# Patient Record
Sex: Male | Born: 1965
Health system: Southern US, Community
[De-identification: ages and names within clinical notes are randomized; demographics above are authoritative.]

---

## 2000-08-25 ENCOUNTER — Emergency Department (HOSPITAL_COMMUNITY): Admission: EM | Admit: 2000-08-25 | Discharge: 2000-08-25 | Payer: Self-pay | Admitting: Emergency Medicine

## 2006-10-13 ENCOUNTER — Ambulatory Visit (HOSPITAL_BASED_OUTPATIENT_CLINIC_OR_DEPARTMENT_OTHER): Admission: RE | Admit: 2006-10-13 | Discharge: 2006-10-13 | Payer: Self-pay | Admitting: General Surgery

## 2008-05-15 ENCOUNTER — Emergency Department (HOSPITAL_COMMUNITY): Admission: EM | Admit: 2008-05-15 | Discharge: 2008-05-15 | Payer: Self-pay | Admitting: Emergency Medicine

## 2009-11-04 ENCOUNTER — Emergency Department (HOSPITAL_COMMUNITY): Admission: EM | Admit: 2009-11-04 | Discharge: 2009-11-04 | Payer: Self-pay | Admitting: Emergency Medicine

## 2010-07-30 IMAGING — CT CT PELVIS W/O CM
2 of 4 series · 16 of 46 positions shown, 18 images · non-contrast
Comparison: None.

CT ABDOMEN

CLINICAL DATA: 42-year-old male with left flank pain for 2 days.

CT ABDOMEN AND PELVIS WITHOUT CONTRAST
TECHNIQUE: Multidetector CT imaging of the abdomen and pelvis was
performed following the standard protocol without intravenous
contrast.

[Series 2: stone_wo 5.0 b40f st · axial · 0.65mm/px · z∈[-510,-98]mm · 13 of 113 slices shown, 15 images]
[im 5/113  soft-tissue]
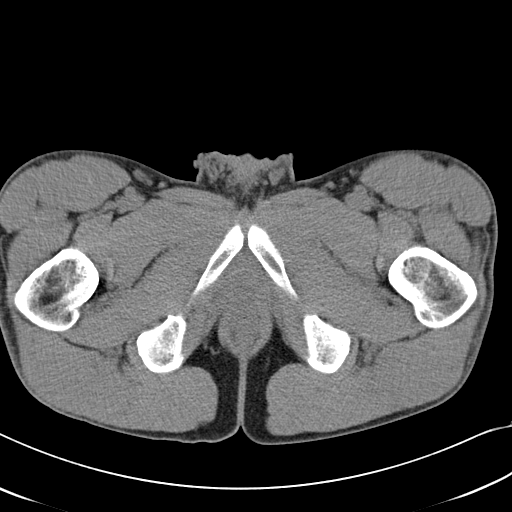
[im 5/113  bone]
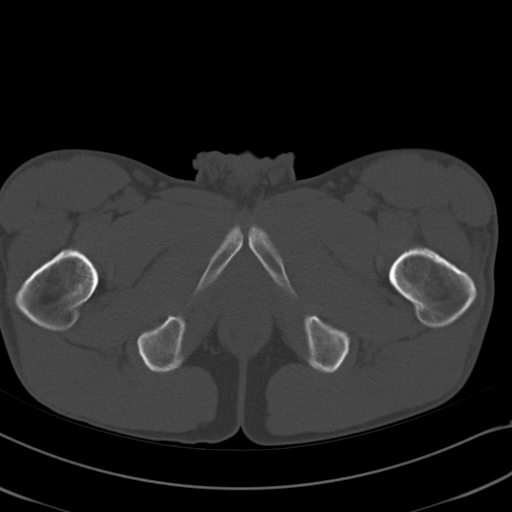
[im 14/113  soft-tissue]
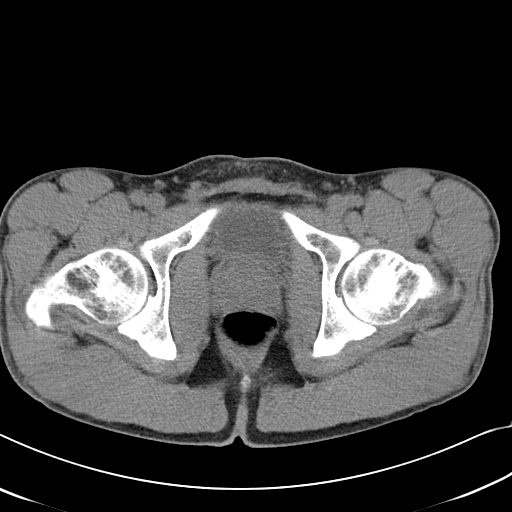
[im 23/113  soft-tissue]
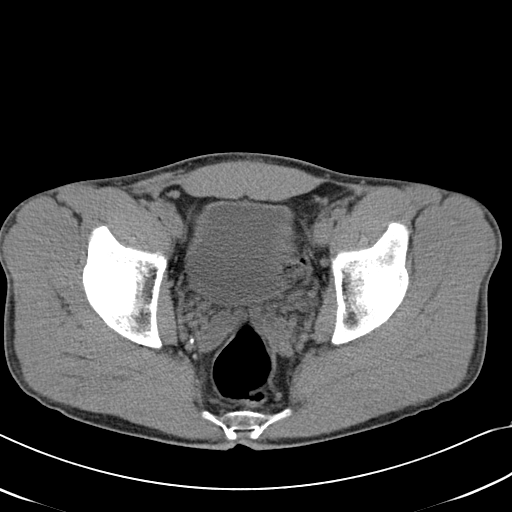
[im 32/113  soft-tissue]
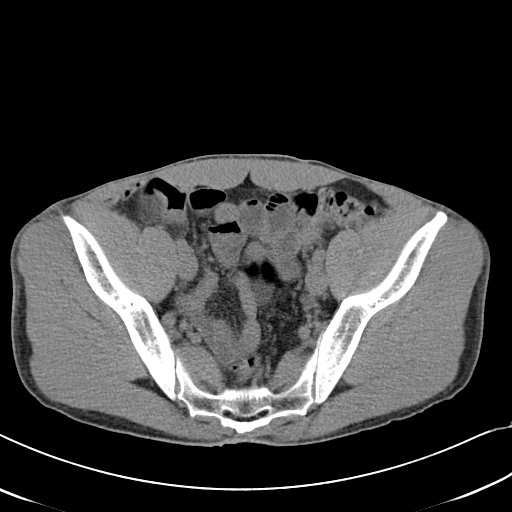
[im 41/113  soft-tissue]
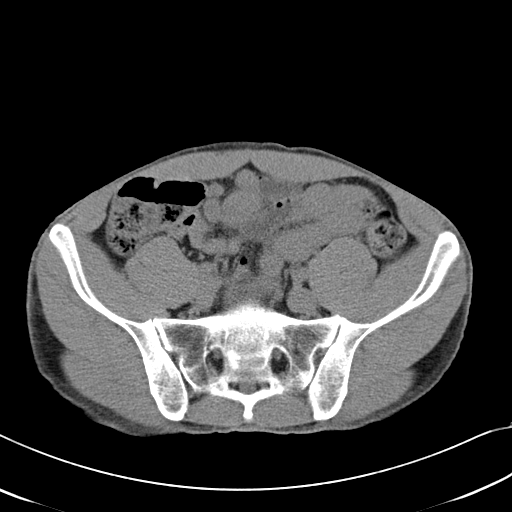
[im 50/113  soft-tissue]
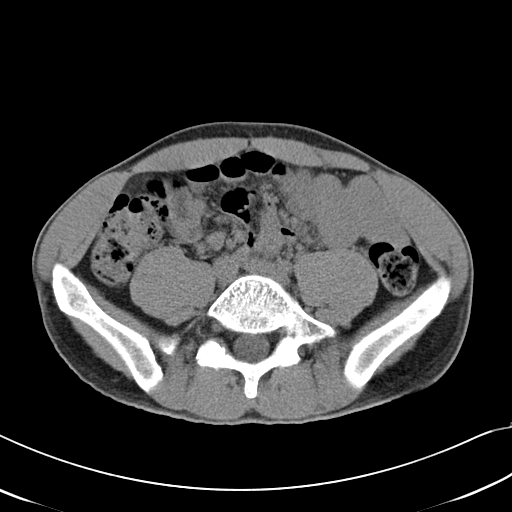
[im 59/113  soft-tissue]
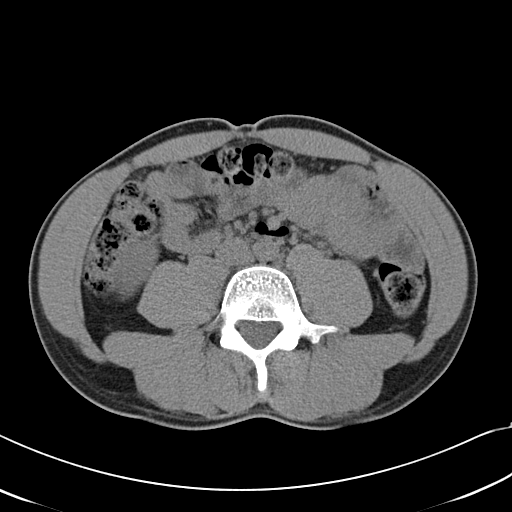
[im 63/113  soft-tissue]
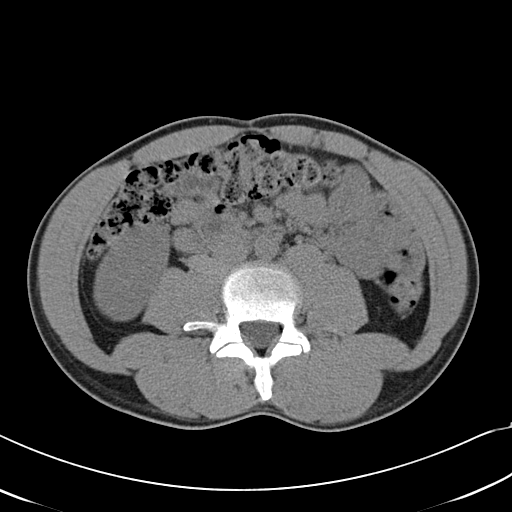
[im 72/113  soft-tissue]
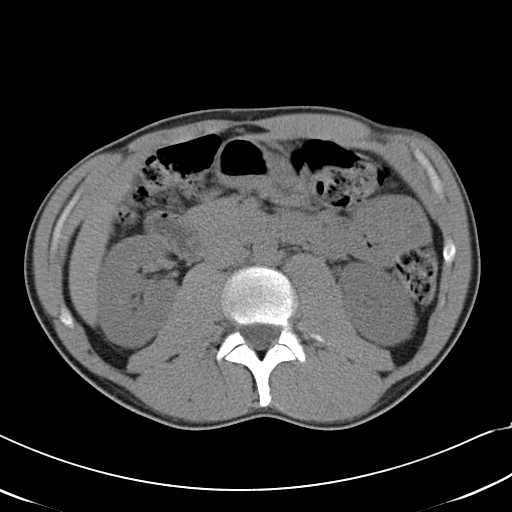
[im 72/113  bone]
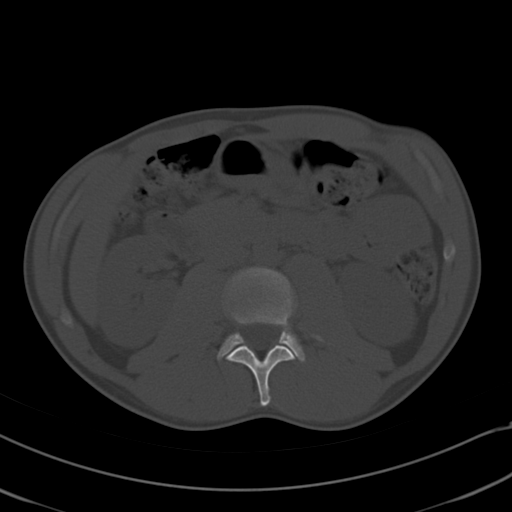
[im 81/113  soft-tissue]
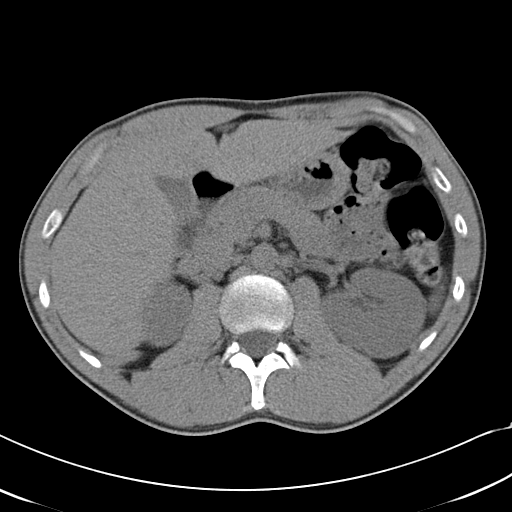
[im 90/113  soft-tissue]
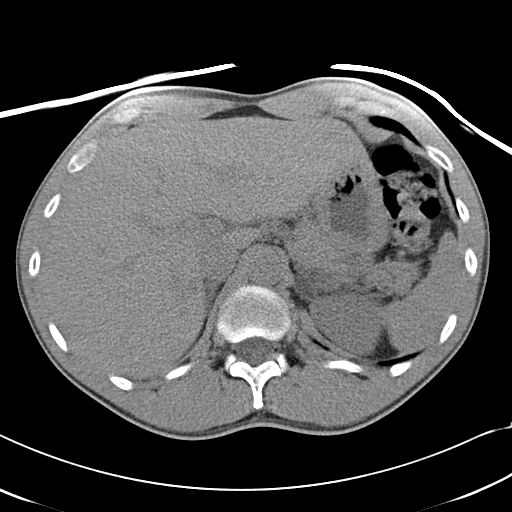
[im 99/113  soft-tissue]
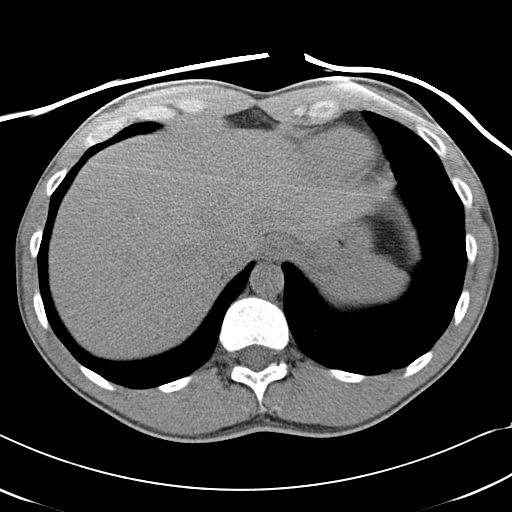
[im 108/113  soft-tissue]
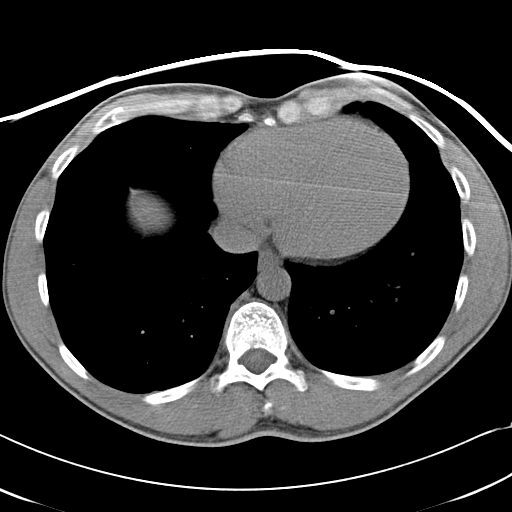

[Series 602: coronal abdomen · coronal · 0.91mm/px · 3 of 121 slices shown]
[im 41/121  soft-tissue]
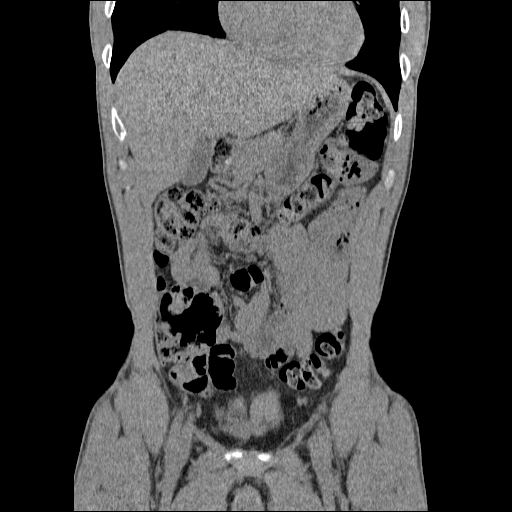
[im 54/121  soft-tissue]
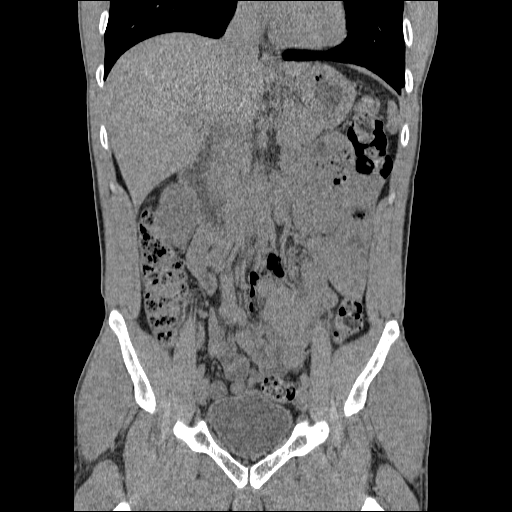
[im 67/121  soft-tissue]
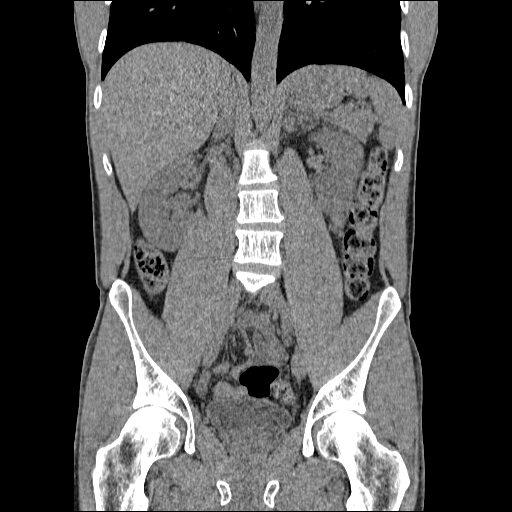

[16 of 46 positions shown; findings below may reference images not displayed]

FINDINGS: Visualized lung bases are clear. No acute osseous
abnormality identified.  Noncontrasted liver, gallbladder,
pancreas, right adrenal gland, stomach, duodenum and proximal small
bowel loops are within normal limits.  The spleen is normal aside
from multiple punctate calcifications, probably calcified
granulomas.  No free fluid.  Visualized distal small bowel loops
and colon are normal aside from retained stool throughout colon.

The left adrenal gland is remarkable for an apparent 12 mm nodule
of the medial limb which is hyperdense. Small adjacent celiac axis
lymph nodes are identified.

The right kidney and right ureter are not obstructed.  No right
nephrolithiasis.

There is mild to moderate hydronephrosis of the left kidney and
left hydroureter.  Mild nephromegaly and evidence of some renal
edema without inflammatory stranding.  Left hydroureter continues
to the pelvis, see below. No left nephrolithiasis.
IMPRESSION: 1.  Acute obstructive uropathy on the left with hydroureter
continuing to the pelvis, see below.
2. Intermediate density 12 mm nodule in the region of the left
adrenal gland medial limb could represent an adrenal nodule, or
less likely a lymph node.  Given its density, it cannot be
confirmed as benign. Non emergent follow-up adrenal protocol MRI
would be necessary for further characterization.

CT PELVIS
FINDINGS: There is a 3 mm obstructing distal left ureteral
calculus located just proximal to the left ureteral vesicle
junction.  Hydroureter on the left.  The bladder is otherwise
within normal limits.

Multiple pelvic phleboliths are seen elsewhere.  No free fluid.
Visualized distal large and small bowel loops are normal. No acute
osseous abnormality identified.
IMPRESSION: Obstructing 3 mm distal left ureteral calculus located just
proximal to the left UVJ.

## 2010-08-13 LAB — POCT I-STAT, CHEM 8
Chloride: 108 mEq/L (ref 96–112)
Creatinine, Ser: 1.2 mg/dL (ref 0.4–1.5)
Glucose, Bld: 99 mg/dL (ref 70–99)
Hemoglobin: 17 g/dL (ref 13.0–17.0)
Potassium: 4 mEq/L (ref 3.5–5.1)

## 2010-10-09 NOTE — Op Note (Signed)
Jason Olson, Jason Olson              ACCOUNT NO.:  [id number]   MEDICAL RECORD NO.:  [id number]          PATIENT TYPE:  AMB   LOCATION:  [facility]SC                          FACILITY:  [facility]   PHYSICIAN:  Leonie Man, M.D.   DATE OF BIRTH:  May 18, 1966   DATE OF PROCEDURE:  10/13/2006  DATE OF DISCHARGE:                               OPERATIVE REPORT   PREOPERATIVE DIAGNOSIS:  Right inguinal hernia.   POSTOPERATIVE DIAGNOSIS:  Right inguinal hernia.   PROCEDURE:  Right inguinal herniorrhaphy with mesh.   SURGEON:  Leonie Man, M.D.   ASSISTANT:  OR nurse.   ANESTHESIA:  General.  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  He noted over the past three  months an enlarging mass in  his right groin which has become a lot more painful and tends to  prolapse on standing at all times.  He wishes this repaired and comes to  the operating room after the risks and potential benefits of surgery  been discussed.  All questions answered and consent obtained.   PROCEDURE:  The patient is positioned supinely and following the  induction of satisfactory general anesthesia, the lower abdomen was  prepped and draped to be included in a sterile operative field.  A  transverse incision in the lower abdominal crease is carried down  through skin and subcutaneous tissue dissecting down to the external  oblique aponeurosis.  The external oblique aponeurosis was opened up  through the external inguinal ring and the spermatic cord is elevated  and held with a Penrose drain.  Dissection along the medial aspects of  the spermatic cord noted a large lipoma and a large hernia sac.  These  were dissected free from the spermatic cord, carried up to the internal  ring.  The lipoma was clamped, amputated and suture ligated at its base.  The hernia sac was opened.  There were no intra-abdominal contents  within it and the hernia  sac was ligated at its base with 2-0 silk  suture.  The redundant sac was amputated and discarded.  The floor of  the inguinal canal was then repaired with a patch of polypropylene mesh  sewn in at the pubic tubercle and carried along the conjoined tendon to  the internal ring and again from the pubic tubercle with 2-0 Novofil  along the shelving edge of Poupart's ligament to the internal ring.  The  mesh was split and to allow the cord to protrude between the leaflets of  the mesh and the mesh was then trimmed and sewn down to the internal  oblique muscle behind the spermatic cord, thus creating a new internal  ring.  The internal ring was tested.  There was no constriction of the  cord.  Sponge and instrument counts were verified.  All areas of  dissection checked for hemostasis, noted to be dry.  The external  oblique aponeurosis closed over the spermatic cord with 2-0 Vicryl,  recreating an external ring.  The Scarpa fascia was then  closed with 3-0  Vicryl suture and skin closed with a running  4-0 Monocryl suture.  The incision is reinforced with Steri-Strips and  sterile dressings applied.  The anesthetic reversed and the patient  removed from the operating room to the recovery room in stable  condition.  He tolerated the procedure well.      Leonie Man, M.D.  Electronically Signed     PB/MEDQ  D:  10/13/2006  T:  10/14/2006  Job:  [id number]

## 2011-03-01 LAB — URINALYSIS, ROUTINE W REFLEX MICROSCOPIC
Bilirubin Urine: NEGATIVE
Specific Gravity, Urine: 1.017 (ref 1.005–1.030)
pH: 7 (ref 5.0–8.0)

## 2011-03-01 LAB — URINE MICROSCOPIC-ADD ON

## 2013-01-11 ENCOUNTER — Ambulatory Visit (INDEPENDENT_AMBULATORY_CARE_PROVIDER_SITE_OTHER): Payer: 59 | Admitting: Family Medicine

## 2013-01-11 VITALS — BP 122/74 | HR 61 | Temp 98.0°F | Resp 18 | Ht 71.0 in | Wt 139.0 lb

## 2013-01-11 DIAGNOSIS — R6889 Other general symptoms and signs: Secondary | ICD-10-CM

## 2013-01-11 DIAGNOSIS — J029 Acute pharyngitis, unspecified: Secondary | ICD-10-CM

## 2013-01-11 DIAGNOSIS — J019 Acute sinusitis, unspecified: Secondary | ICD-10-CM

## 2013-01-11 LAB — POCT RAPID STREP A (OFFICE): Rapid Strep A Screen: NEGATIVE

## 2013-01-11 LAB — POCT INFLUENZA A/B
Influenza A, POC: NEGATIVE
Influenza B, POC: NEGATIVE

## 2013-01-11 MED ORDER — FLUTICASONE PROPIONATE 50 MCG/ACT NA SUSP: 2.0000 | Freq: Every day | NASAL | Status: DC

## 2013-01-11 MED ORDER — AMOXICILLIN-POT CLAVULANATE 875-125 MG PO TABS: 1.0000 | ORAL_TABLET | Freq: Two times a day (BID) | ORAL | Status: DC

## 2013-01-11 NOTE — Progress Notes (Signed)
[facility]:  Office Visit  Chief Complaint:  Chief Complaint  Patient presents with  . Cough    x 1 week  . Nasal Congestion    x 1 week  . Fever    x 1 week  . Sore Throat    since saturday (2 days)    HPI: Jason Olson is a 47 y.o. male who complains of  2 day history of generalized weakness, and nasal congestion and productive cough, and throat pain. Sinus pain, denies ear pain. + subjective fevers, chills. Took Nyquil and dayquil and liquid advil. Denies allergies, asthma, stopped smoking about 6 months ago. Deneis SOB, wheezes.   History reviewed. No pertinent past medical history. History reviewed. No pertinent past surgical history. History   Social History  . Marital Status: Married    Spouse Name: N/A    Number of Children: N/A  . Years of Education: N/A   Social History Main Topics  . Smoking status: Former Smoker    Quit date: 08/11/2012  . Smokeless tobacco: None  . Alcohol Use: No  . Drug Use: No  . Sexual Activity: Yes   Other Topics Concern  . None   Social History Narrative  . None   Family History  Problem Relation Age of Onset  . Stroke Mother   . Cancer Father    No Known Allergies Prior to Admission medications   Medication Sig Start Date End Date Taking? Authorizing Provider  ibuprofen (ADVIL,MOTRIN) 100 MG/5ML suspension Take 200 mg by mouth every 4 (four) hours as needed for fever.   Yes Historical Provider, MD  Pseudoeph-Doxylamine-DM-APAP (NYQUIL PO) Take by mouth.   Yes Historical Provider, MD  Pseudoephedrine-APAP-DM (DAYQUIL PO) Take by mouth.   Yes Historical Provider, MD     ROS: The patient denies night sweats, unintentional weight loss, chest pain, palpitations, wheezing, dyspnea on exertion, nausea, vomiting, abdominal pain, dysuria, hematuria, melena, numbness, weakness, or tingling.   All other systems have been reviewed and were otherwise negative with the exception of those mentioned in the HPI and  as above.    PHYSICAL EXAM: Filed Vitals:   01/11/13 1247  BP: 122/74  Pulse: 61  Temp: 98 F (36.7 C)  Resp: 18   Filed Vitals:   01/11/13 1247  Height: 5\' 11"  (1.803 m)  Weight: 139 lb (63.05 kg)   Body mass index is 19.4 kg/(m^2).  General: Alert, no acute distress HEENT:  Normocephalic, atraumatic, oropharynx patent. EOMI, PERRLA + boggy nares, TM nl, + sinus tnederness but lots of nasal congestions Cardiovascular:  Regular rate and rhythm, no rubs murmurs or gallops.  No Carotid bruits, radial pulse intact. No pedal edema.  Respiratory: Clear to auscultation bilaterally.  No wheezes, rales, or rhonchi.  No cyanosis, no use of accessory musculature GI: No organomegaly, abdomen is soft and non-tender, positive bowel sounds.  No masses. Skin: No rashes. Neurologic: Facial musculature symmetric. Psychiatric: Patient is appropriate throughout our interaction. Lymphatic: No cervical lymphadenopathy Musculoskeletal: Gait intact.   LABS: Results for orders placed in visit on 01/11/13  POCT INFLUENZA A/B      Result Value Range   Influenza A, POC Negative     Influenza B, POC Negative    POCT RAPID STREP A (OFFICE)      Result Value Range   Rapid Strep A Screen Negative  Negative     EKG/XRAY:   Primary read interpreted by Dr. Conley Rolls at [facility].   ASSESSMENT/PLAN: Encounter Diagnoses  Name Primary?  . Sore throat Yes  . Flu-like symptoms   . Sinusitis, acute    Rx Flonase, Augmentin F/u prn Work note given Gross sideeffects, risk and benefits, and alternatives of medications d/w patient. Patient is aware that all medications have potential sideeffects and we are unable to predict every sideeffect or drug-drug interaction that may occur.  Abbrielle Batts PHUONG, DO 01/11/2013 2:25 PM

## 2013-01-11 NOTE — Patient Instructions (Signed)

## 2017-05-02 DIAGNOSIS — E7800 Pure hypercholesterolemia, unspecified: Secondary | ICD-10-CM | POA: Diagnosis not present

## 2017-05-02 DIAGNOSIS — Z0000 Encounter for general adult medical examination without abnormal findings: Secondary | ICD-10-CM | POA: Diagnosis not present

## 2018-01-29 DIAGNOSIS — Z125 Encounter for screening for malignant neoplasm of prostate: Secondary | ICD-10-CM | POA: Diagnosis not present

## 2018-01-29 DIAGNOSIS — E7800 Pure hypercholesterolemia, unspecified: Secondary | ICD-10-CM | POA: Diagnosis not present

## 2018-01-29 DIAGNOSIS — Z0000 Encounter for general adult medical examination without abnormal findings: Secondary | ICD-10-CM | POA: Diagnosis not present

## 2018-05-08 DIAGNOSIS — Z1211 Encounter for screening for malignant neoplasm of colon: Secondary | ICD-10-CM | POA: Diagnosis not present

## 2018-05-08 DIAGNOSIS — D122 Benign neoplasm of ascending colon: Secondary | ICD-10-CM | POA: Diagnosis not present

## 2019-08-01 ENCOUNTER — Ambulatory Visit: Payer: Self-pay | Attending: Internal Medicine

## 2019-08-01 DIAGNOSIS — Z23 Encounter for immunization: Secondary | ICD-10-CM | POA: Insufficient documentation

## 2019-08-01 NOTE — Progress Notes (Signed)
   Covid-19 Vaccination Clinic  Name:  Jason Olson    MRN: [id number] DOB: 1966/02/16  08/01/2019  Mr. [name] was observed post Covid-19 immunization for 15 minutes without incident. He was provided with Vaccine Information Sheet and instruction to access the V-Safe system.   Mr. [name] was instructed to call 911 with any severe reactions post vaccine: Marland Kitchen Difficulty breathing  . Swelling of face and throat  . A fast heartbeat  . A bad rash all over body  . Dizziness and weakness   Immunizations Administered    Name Date Dose VIS Date Route   Pfizer COVID-19 Vaccine 08/01/2019  1:15 PM 0.3 mL 05/07/2019 Intramuscular   Manufacturer: [place]   Lot: [id number]   [place]: [id number]

## 2019-08-31 ENCOUNTER — Ambulatory Visit: Payer: Self-pay | Attending: Internal Medicine

## 2019-08-31 DIAGNOSIS — Z23 Encounter for immunization: Secondary | ICD-10-CM

## 2019-08-31 NOTE — Progress Notes (Signed)
   Covid-19 Vaccination Clinic  Name:  Pritesh Auringer    MRN: [id number] DOB: 1965-11-14  08/31/2019  Mr. Lardner was observed post Covid-19 immunization for 15 minutes without incident. He was provided with Vaccine Information Sheet and instruction to access the V-Safe system.   Mr. Shelburn was instructed to call 911 with any severe reactions post vaccine: Marland Kitchen Difficulty breathing  . Swelling of face and throat  . A fast heartbeat  . A bad rash all over body  . Dizziness and weakness   Immunizations Administered    Name Date Dose VIS Date Route   Pfizer COVID-19 Vaccine 08/31/2019 11:48 AM 0.3 mL 05/07/2019 Intramuscular   Manufacturer: [place]   Lot: [id number]   [place]: [id number]

## 2020-03-23 DIAGNOSIS — Z125 Encounter for screening for malignant neoplasm of prostate: Secondary | ICD-10-CM | POA: Diagnosis not present

## 2020-03-23 DIAGNOSIS — Z0000 Encounter for general adult medical examination without abnormal findings: Secondary | ICD-10-CM | POA: Diagnosis not present

## 2020-03-23 DIAGNOSIS — H6122 Impacted cerumen, left ear: Secondary | ICD-10-CM | POA: Diagnosis not present

## 2020-03-23 DIAGNOSIS — J309 Allergic rhinitis, unspecified: Secondary | ICD-10-CM | POA: Diagnosis not present

## 2020-03-23 DIAGNOSIS — E7800 Pure hypercholesterolemia, unspecified: Secondary | ICD-10-CM | POA: Diagnosis not present

## 2020-11-24 DIAGNOSIS — U071 COVID-19: Secondary | ICD-10-CM | POA: Diagnosis not present

## 2020-12-19 DIAGNOSIS — E7800 Pure hypercholesterolemia, unspecified: Secondary | ICD-10-CM | POA: Diagnosis not present

## 2020-12-19 DIAGNOSIS — Z880 Allergy status to penicillin: Secondary | ICD-10-CM | POA: Diagnosis not present

## 2020-12-19 DIAGNOSIS — J309 Allergic rhinitis, unspecified: Secondary | ICD-10-CM | POA: Diagnosis not present

## 2021-04-24 DIAGNOSIS — E291 Testicular hypofunction: Secondary | ICD-10-CM | POA: Diagnosis not present

## 2021-04-24 DIAGNOSIS — E7800 Pure hypercholesterolemia, unspecified: Secondary | ICD-10-CM | POA: Diagnosis not present

## 2021-04-24 DIAGNOSIS — Z125 Encounter for screening for malignant neoplasm of prostate: Secondary | ICD-10-CM | POA: Diagnosis not present

## 2021-04-24 DIAGNOSIS — Z0000 Encounter for general adult medical examination without abnormal findings: Secondary | ICD-10-CM | POA: Diagnosis not present

## 2021-04-24 DIAGNOSIS — R6882 Decreased libido: Secondary | ICD-10-CM | POA: Diagnosis not present

## 2021-05-15 DIAGNOSIS — R7989 Other specified abnormal findings of blood chemistry: Secondary | ICD-10-CM | POA: Diagnosis not present

## 2021-05-15 DIAGNOSIS — E291 Testicular hypofunction: Secondary | ICD-10-CM | POA: Diagnosis not present

## 2021-06-26 DIAGNOSIS — R42 Dizziness and giddiness: Secondary | ICD-10-CM | POA: Diagnosis not present

## 2021-06-27 DIAGNOSIS — H8113 Benign paroxysmal vertigo, bilateral: Secondary | ICD-10-CM | POA: Diagnosis not present

## 2021-06-27 DIAGNOSIS — H6123 Impacted cerumen, bilateral: Secondary | ICD-10-CM | POA: Diagnosis not present

## 2021-09-27 DIAGNOSIS — U071 COVID-19: Secondary | ICD-10-CM | POA: Diagnosis not present

## 2022-04-25 ENCOUNTER — Ambulatory Visit
Admission: RE | Admit: 2022-04-25 | Discharge: 2022-04-25 | Disposition: A | Discharge status: Home / Self Care | Payer: BC Managed Care – PPO | Source: Ambulatory Visit | Attending: Family Medicine | Admitting: Family Medicine

## 2022-04-25 ENCOUNTER — Other Ambulatory Visit: Payer: Self-pay | Admitting: Family Medicine

## 2022-04-25 DIAGNOSIS — Z0000 Encounter for general adult medical examination without abnormal findings: Secondary | ICD-10-CM | POA: Diagnosis not present

## 2022-04-25 DIAGNOSIS — R062 Wheezing: Secondary | ICD-10-CM | POA: Diagnosis not present

## 2022-04-25 DIAGNOSIS — R030 Elevated blood-pressure reading, without diagnosis of hypertension: Secondary | ICD-10-CM | POA: Diagnosis not present

## 2022-04-25 DIAGNOSIS — E7800 Pure hypercholesterolemia, unspecified: Secondary | ICD-10-CM | POA: Diagnosis not present

## 2022-04-25 DIAGNOSIS — Z125 Encounter for screening for malignant neoplasm of prostate: Secondary | ICD-10-CM | POA: Diagnosis not present

## 2022-08-13 ENCOUNTER — Encounter (INDEPENDENT_AMBULATORY_CARE_PROVIDER_SITE_OTHER): Payer: Self-pay | Admitting: Ophthalmology

## 2023-01-24 ENCOUNTER — Ambulatory Visit: Payer: BC Managed Care – PPO | Admitting: Surgical

## 2023-02-05 ENCOUNTER — Ambulatory Visit (INDEPENDENT_AMBULATORY_CARE_PROVIDER_SITE_OTHER): Payer: PRIVATE HEALTH INSURANCE | Admitting: Orthopedic Surgery

## 2023-02-05 ENCOUNTER — Other Ambulatory Visit (INDEPENDENT_AMBULATORY_CARE_PROVIDER_SITE_OTHER): Payer: Self-pay

## 2023-02-05 ENCOUNTER — Encounter: Payer: Self-pay | Admitting: Orthopedic Surgery

## 2023-02-05 DIAGNOSIS — S83242A Other tear of medial meniscus, current injury, left knee, initial encounter: Secondary | ICD-10-CM

## 2023-02-05 DIAGNOSIS — M25562 Pain in left knee: Secondary | ICD-10-CM

## 2023-02-07 ENCOUNTER — Encounter: Payer: Self-pay | Admitting: Orthopedic Surgery

## 2023-02-07 NOTE — Progress Notes (Unsigned)
Office Visit Note   Patient: Jason Olson           Date of Birth: 12-02-1965           MRN: [id number] Visit Date: 02/05/2023 Requested by: Tally Joe, MD [id number] [place],  [place] [address] PCP: Tally Joe, MD  Subjective: Chief Complaint  Patient presents with   Left Knee - Pain    HPI: Jason Olson is a 57 y.o. male who presents to the office reporting left knee pain for 6 weeks.  Denies any history of injury.  The pain was acute onset.  The pain does wake him from sleep at night.  Has to lift his need to roll over if he does not need his a pillow between his knees.  Does report swelling and stiffness but no weakness or giving way.  Did try Celebrex for a month which helped some.  No prior knee surgery.  Localizes pain to the posterior medial aspect of the knee.  No definite mechanical symptoms.  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                ROS: All systems reviewed are negative as they relate to the chief complaint within the history of present illness.  Patient denies fevers or chills.  Assessment & Plan: Visit Diagnoses:  1. Left knee pain, unspecified chronicity     Plan: Impression is left knee pain with minimal degenerative changes on plain radiographs.  Trace effusion is present.  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  We will try an injection into the left knee today with 4-week return for decision for or against MRI scanning at that time.  Follow-Up Instructions: No follow-ups on file.   Orders:  Orders Placed This Encounter  Procedures   XR KNEE 3 VIEW LEFT   No orders of the defined types were placed in this encounter.     Procedures: Large Joint Inj: L knee on 02/05/2023 9:10 AM Indications: diagnostic evaluation, joint swelling and pain Details: 18 G 1.5 in needle, superolateral approach  Arthrogram: No  Medications: 5 mL lidocaine 1  %; 40 mg methylPREDNISolone acetate 40 MG/ML; 4 mL bupivacaine 0.25 % Outcome: tolerated well, no immediate complications Procedure, treatment alternatives, risks and benefits explained, specific risks discussed. Consent was given by the patient. Immediately prior to procedure a time out was called to verify the correct patient, procedure, equipment, support staff and site/side marked as required. Patient was prepped and draped in the usual sterile fashion.       Clinical Data: No additional findings.  Objective: Vital Signs: There were no vitals taken for this visit.  Physical Exam:  Constitutional: Patient appears well-developed HEENT:  Head: Normocephalic Eyes:EOM are normal Neck: Normal range of motion Cardiovascular: Normal rate Pulmonary/chest: Effort normal Neurologic: Patient is alert Skin: Skin is warm Psychiatric: Patient has normal mood and affect  Ortho Exam: Ortho exam demonstrates trace effusion.  Posterior medial joint line tenderness to palpation with positive McMurray compression testing.  Collateral and cruciate ligaments are stable.  No groin pain with internal/external rotation of the leg.  Minimal patellofemoral crepitus.  Full range of motion bilaterally.  Specialty Comments:  No specialty comments available.  Imaging: No results found.   PMFS History: There are no problems to display for this patient.  History reviewed. No pertinent past medical history.  Family History  Problem Relation Age of Onset   Stroke Mother    Cancer Father     History reviewed. No pertinent surgical history. Social History   Occupational History   Not on file  Tobacco Use   Smoking status: Former    Current packs/day: 0.00    Types: Cigarettes    Quit date: 08/11/2012    Years since quitting: 10.4   Smokeless tobacco: Not on file  Substance and Sexual Activity   Alcohol use: No   Drug use: No   Sexual activity: Yes

## 2023-02-08 MED ORDER — METHYLPREDNISOLONE ACETATE 40 MG/ML IJ SUSP
40.0000 mg | INTRAMUSCULAR | Status: AC | PRN
  Administered 2023-02-05: 40 mg via INTRA_ARTICULAR

## 2023-02-08 MED ORDER — BUPIVACAINE HCL 0.25 % IJ SOLN
4.0000 mL | INTRAMUSCULAR | Status: AC | PRN
  Administered 2023-02-05: 4 mL via INTRA_ARTICULAR

## 2023-02-08 MED ORDER — LIDOCAINE HCL 1 % IJ SOLN
5.0000 mL | INTRAMUSCULAR | Status: AC | PRN
  Administered 2023-02-05: 5 mL

## 2023-03-05 ENCOUNTER — Ambulatory Visit: Payer: PRIVATE HEALTH INSURANCE | Admitting: Orthopedic Surgery

## 2023-05-06 ENCOUNTER — Other Ambulatory Visit (HOSPITAL_COMMUNITY): Payer: Self-pay | Admitting: Family Medicine

## 2023-05-06 DIAGNOSIS — E7800 Pure hypercholesterolemia, unspecified: Secondary | ICD-10-CM

## 2023-05-08 ENCOUNTER — Encounter: Payer: Self-pay | Admitting: Primary Care

## 2023-05-13 ENCOUNTER — Ambulatory Visit (HOSPITAL_COMMUNITY)
Admission: RE | Admit: 2023-05-13 | Discharge: 2023-05-13 | Disposition: A | Discharge status: Home / Self Care | Payer: Self-pay | Source: Ambulatory Visit | Attending: Family Medicine | Admitting: Family Medicine

## 2023-05-13 DIAGNOSIS — E7800 Pure hypercholesterolemia, unspecified: Secondary | ICD-10-CM | POA: Insufficient documentation

## 2023-06-13 ENCOUNTER — Institutional Professional Consult (permissible substitution): Payer: PRIVATE HEALTH INSURANCE | Admitting: Internal Medicine

## 2023-08-15 ENCOUNTER — Other Ambulatory Visit: Payer: PRIVATE HEALTH INSURANCE

## 2023-08-15 ENCOUNTER — Ambulatory Visit: Payer: PRIVATE HEALTH INSURANCE | Admitting: Internal Medicine

## 2023-08-15 ENCOUNTER — Encounter: Payer: Self-pay | Admitting: Internal Medicine

## 2023-08-15 VITALS — BP 153/87 | HR 69 | Ht 72.0 in | Wt 158.0 lb

## 2023-08-15 DIAGNOSIS — R0609 Other forms of dyspnea: Secondary | ICD-10-CM

## 2023-08-15 DIAGNOSIS — J45991 Cough variant asthma: Secondary | ICD-10-CM | POA: Diagnosis not present

## 2023-08-15 DIAGNOSIS — R053 Chronic cough: Secondary | ICD-10-CM | POA: Diagnosis not present

## 2023-08-15 DIAGNOSIS — R062 Wheezing: Secondary | ICD-10-CM

## 2023-08-15 LAB — CBC WITH DIFFERENTIAL/PLATELET
Basophils Absolute: 0.1 10*3/uL (ref 0.0–0.1)
Basophils Relative: 1 % (ref 0.0–3.0)
Eosinophils Absolute: 0.5 10*3/uL (ref 0.0–0.7)
Eosinophils Relative: 6.4 % — ABNORMAL HIGH (ref 0.0–5.0)
HCT: 49.7 % (ref 39.0–52.0)
Hemoglobin: 16.8 g/dL (ref 13.0–17.0)
Lymphocytes Relative: 19.9 % (ref 12.0–46.0)
Lymphs Abs: 1.5 10*3/uL (ref 0.7–4.0)
MCHC: 33.7 g/dL (ref 30.0–36.0)
MCV: 93 fl (ref 78.0–100.0)
Monocytes Absolute: 0.5 10*3/uL (ref 0.1–1.0)
Monocytes Relative: 7.4 % (ref 3.0–12.0)
Neutro Abs: 4.9 10*3/uL (ref 1.4–7.7)
Neutrophils Relative %: 65.3 % (ref 43.0–77.0)
Platelets: 309 10*3/uL (ref 150.0–400.0)
RBC: 5.34 Mil/uL (ref 4.22–5.81)
RDW: 13.2 % (ref 11.5–15.5)
WBC: 7.4 10*3/uL (ref 4.0–10.5)

## 2023-08-15 LAB — NITRIC OXIDE: Nitric Oxide: 111

## 2023-08-15 MED ORDER — FLUTICASONE FUROATE-VILANTEROL 200-25 MCG/ACT IN AEPB: 1.0000 | INHALATION_SPRAY | Freq: Every day | RESPIRATORY_TRACT | Status: AC

## 2023-08-15 MED ORDER — ALBUTEROL SULFATE HFA 108 (90 BASE) MCG/ACT IN AERS: 2.0000 | INHALATION_SPRAY | Freq: Four times a day (QID) | RESPIRATORY_TRACT | 2 refills | Status: AC | PRN

## 2023-08-15 MED ORDER — PREDNISONE 10 MG PO TABS: ORAL_TABLET | ORAL | 0 refills | Status: DC

## 2023-08-15 NOTE — Patient Instructions (Addendum)
 ICD-10-CM   1. Cough variant asthma  J45.991 fluticasone furoate-vilanterol (BREO ELLIPTA) 200-25 MCG/ACT 1 puff    Pulmonary function test    CBC w/Diff    Perennial allergen profile IgE    Ambulatory Referral for Lung Cancer Scre    2. Wheezing  R06.2 fluticasone furoate-vilanterol (BREO ELLIPTA) 200-25 MCG/ACT 1 puff    Pulmonary function test    CBC w/Diff    Perennial allergen profile IgE    Ambulatory Referral for Lung Cancer Scre    3. Cough, persistent  R05.3 Nitric oxide    fluticasone furoate-vilanterol (BREO ELLIPTA) 200-25 MCG/ACT 1 puff    Pulmonary function test    CBC w/Diff    Perennial allergen profile IgE    Ambulatory Referral for Lung Cancer Scre    4. DOE (dyspnea on exertion)  R06.09 fluticasone furoate-vilanterol (BREO ELLIPTA) 200-25 MCG/ACT 1 puff    Pulmonary function test    CBC w/Diff    Perennial allergen profile IgE    Ambulatory Referral for Lung Cancer Scre     Diagnosis most likely is asthma allergic and cough variant. Previous smoking history puts her at risk for COPD and lung cancer  Plan - 5-day short course prednisone - Start Breo scheduled - Start albuterol as needed - Do full pulmonary function test - Referral lung cancer screening program   fOLLOWUP  Return in about 8 weeks (around 10/10/2023) for with any of the APPS, Face to Face Visit AFTER TESTS.

## 2023-08-15 NOTE — Progress Notes (Signed)
 OV 08/15/2023  Subjective:  Patient ID: Jason Olson, male , DOB: 10-Sep-1965 , age 57 y.o. , MRN: [id number] , ADDRESS: [address] [place] [address] PCP Tally Joe, MD Patient Care Team: Tally Joe, MD as PCP - General (Family Medicine)  This Provider for this visit: Treatment Team:  Attending Provider: Kalman Shan, MD    08/15/2023 -   Chief Complaint  Patient presents with   Consult    Pt states he has had trouble breathing at night, 2 month prior had flu/ Pneumonia. Ct was done by PCP showed nothing, past smoker       HPI Jason Olson 58 y.o. -informed the CMA that he is here for chronic cough but told me that the chronic cough is really mild.  He informed me that he has shortness of breath for the last 1 year.  Background: Used to live outside [place] in [place].  Around 15 or 16 years ago he moved to [place].  Around this time he quit smoking.  He had started smoking at age 43 and quit 15 years ago at age 73.  Therefore it makes him a 25 pack smoking history having smoked 1 pack/day.  Short after moving to [place] has been bothered by perennial allergies.  He is on Allegra all the time.  He says he is not able to identify a single specific factor.  He works with [employer] doing [employer] but denies any dust exposure.  At home there is no mold or mildew or down pillow exposure or cockroach exposure.  His wife suffers from COPD Graves' disease and coronary artery disease status post coronary stents.  For the last 1 8 been bothered by shortness of breath with exertion relieved by rest.  Rated as moderate.  If he does yard work it will come on or if he does anything "too much".  It is associated with wheezing at night when he lies down.  Is moderate in intensity.  He has a mild cough [although he told the CMA that he coughs all night].  He feels something is stuck in the throat.  When he used his wife albuterol he  brings up clear sputum.  He had influenza along with his wife 2 months ago and this made everything worse but now he is somewhat better.    Dr Gretta Cool Reflux Symptom Index (> 13-15 suggestive of LPR cough) 0 -> 5  =  none ->severe problem 08/15/2023 Feno 111 ppb  Hoarseness of problem with voice 0  Clearing  Of Throat 2  Excess throat mucus or feeling of post nasal drip 2  Difficulty swallowing food, liquid or tablets 0  Cough after eating or lying down 2  Breathing difficulties or choking episodes 2  Troublesome or annoying cough 0  Sensation of something sticking in throat or lump in throat 2  Heartburn, chest pain, indigestion, or stomach acid coming up 0  TOTAL 10      FENO 111ppb and elevated   DDENDUM REPORT: 05/25/2023 23:39   EXAM: OVER-READ INTERPRETATION  CT CHEST   The following report is an over-read performed by radiologist Dr. Alcide Clever of [facility] on 05/25/2023. This over-read does not include interpretation of cardiac or coronary anatomy or pathology. The coronary calcium score interpretation by the cardiologist is attached.   COMPARISON:  None.   FINDINGS: Cardiovascular: There are no significant extracardiac vascular findings.   Mediastinum/Nodes: Scattered calcified hilar nodes  are noted consistent with prior granulomatous disease.   Lungs/Pleura: There is no pleural effusion. The visualized lungs appear clear.   Upper abdomen: No significant findings in the visualized upper abdomen.   Musculoskeletal/Chest wall: No chest wall mass or suspicious osseous findings within the visualized chest.   IMPRESSION: Changes consistent with prior granulomatous disease.     Electronically Signed   By: Alcide Clever M.D.   On: 05/25/2023 23:39       PFT      No data to display             LAB RESULTS last 96 hours No results found.       has no past medical history on file.   reports that he quit smoking  about 11 years ago. His smoking use included cigarettes. He does not have any smokeless tobacco history on file.  No past surgical history on file.  No Known Allergies  Immunization History  Administered Date(s) Administered   PFIZER(Purple Top)SARS-COV-2 Vaccination 08/01/2019, 08/31/2019    Family History  Problem Relation Age of Onset   Stroke Mother    Cancer Father      Current Outpatient Medications:    albuterol (VENTOLIN HFA) 108 (90 Base) MCG/ACT inhaler, Inhale 2 puffs into the lungs every 6 (six) hours as needed for wheezing or shortness of breath., Disp: 1 each, Rfl: 2   amoxicillin-clavulanate (AUGMENTIN) 875-125 MG per tablet, Take 1 tablet by mouth 2 (two) times daily., Disp: 20 tablet, Rfl: 0   fluticasone (FLONASE) 50 MCG/ACT nasal spray, Place 2 sprays into the nose daily., Disp: 16 g, Rfl: 0   ibuprofen (ADVIL,MOTRIN) 100 MG/5ML suspension, Take 200 mg by mouth every 4 (four) hours as needed for fever., Disp: , Rfl:    predniSONE (DELTASONE) 10 MG tablet, Please take prednisone 40 mg x1 day, then 30 mg x1 day, then 20 mg x1 day, then 10 mg x1 day, and then 5 mg x1 day and stop, Disp: 11 tablet, Rfl: 0   Pseudoeph-Doxylamine-DM-APAP (NYQUIL PO), Take by mouth., Disp: , Rfl:    Pseudoephedrine-APAP-DM (DAYQUIL PO), Take by mouth., Disp: , Rfl:   Current Facility-Administered Medications:    fluticasone furoate-vilanterol (BREO ELLIPTA) 200-25 MCG/ACT 1 puff, 1 puff, Inhalation, Daily,       Objective:   Vitals:   08/15/23 0844  BP: (!) 153/87  Pulse: 69  SpO2: 98%  Weight: 158 lb (71.7 kg)  Height: 6' (1.829 m)    Estimated body mass index is 21.43 kg/m as calculated from the following:   Height as of this encounter: 6' (1.829 m).   Weight as of this encounter: 158 lb (71.7 kg).  @WEIGHTCHANGE @  [employer]   08/15/23 0844  Weight: 158 lb (71.7 kg)     Physical Exam   General: No distress. LOOKS WELL O2 at rest: NO Cane present:  NO Sitting in wheel chair: NO Frail: NO Obese: NO Neuro: Alert and Oriented x 3. GCS 15. Speech normal Psych: Pleasant Resp:  Barrel Chest - NO.  Wheeze - NO, Crackles - NO, No overt respiratory distress CVS: Normal heart sounds. Murmurs - NO Ext: Stigmata of Connective Tissue Disease - NO HEENT: Normal upper airway. PEERL +. No post nasal drip        Assessment:       ICD-10-CM   1. Cough variant asthma  J45.991 fluticasone furoate-vilanterol (BREO ELLIPTA) 200-25 MCG/ACT 1 puff    Pulmonary function test    CBC  w/Diff    Perennial allergen profile IgE    Ambulatory Referral for Lung Cancer Scre    2. Wheezing  R06.2 fluticasone furoate-vilanterol (BREO ELLIPTA) 200-25 MCG/ACT 1 puff    Pulmonary function test    CBC w/Diff    Perennial allergen profile IgE    Ambulatory Referral for Lung Cancer Scre    3. Cough, persistent  R05.3 Nitric oxide    fluticasone furoate-vilanterol (BREO ELLIPTA) 200-25 MCG/ACT 1 puff    Pulmonary function test    CBC w/Diff    Perennial allergen profile IgE    Ambulatory Referral for Lung Cancer Scre    4. DOE (dyspnea on exertion)  R06.09 fluticasone furoate-vilanterol (BREO ELLIPTA) 200-25 MCG/ACT 1 puff    Pulmonary function test    CBC w/Diff    Perennial allergen profile IgE    Ambulatory Referral for Lung Cancer Scre         Plan:     Patient Instructions     ICD-10-CM   1. Cough variant asthma  J45.991 fluticasone furoate-vilanterol (BREO ELLIPTA) 200-25 MCG/ACT 1 puff    Pulmonary function test    CBC w/Diff    Perennial allergen profile IgE    Ambulatory Referral for Lung Cancer Scre    2. Wheezing  R06.2 fluticasone furoate-vilanterol (BREO ELLIPTA) 200-25 MCG/ACT 1 puff    Pulmonary function test    CBC w/Diff    Perennial allergen profile IgE    Ambulatory Referral for Lung Cancer Scre    3. Cough, persistent  R05.3 Nitric oxide    fluticasone furoate-vilanterol (BREO ELLIPTA) 200-25 MCG/ACT 1 puff     Pulmonary function test    CBC w/Diff    Perennial allergen profile IgE    Ambulatory Referral for Lung Cancer Scre    4. DOE (dyspnea on exertion)  R06.09 fluticasone furoate-vilanterol (BREO ELLIPTA) 200-25 MCG/ACT 1 puff    Pulmonary function test    CBC w/Diff    Perennial allergen profile IgE    Ambulatory Referral for Lung Cancer Scre     Diagnosis most likely is asthma allergic and cough variant. Previous smoking history puts her at risk for COPD and lung cancer  Plan - 5-day short course prednisone - Start Breo scheduled - Start albuterol as needed - Do full pulmonary function test - Referral lung cancer screening program   fOLLOWUP  Return in about 8 weeks (around 10/10/2023) for with any of the APPS, Face to Face Visit AFTER TESTS.     FOLLOWUP Return in about 8 weeks (around 10/10/2023) for with any of the APPS, Face to Face Visit AFTER TESTS.    SIGNATURE    Dr. Kalman Shan, M.D., F.C.C.P,  Pulmonary and Critical Care Medicine Staff Physician, [facility] Director - Interstitial Lung Disease  Program  Pulmonary Fibrosis [facility] at [facility] [place], [place], [address]  Pager: [phone], If no answer or between  15:00h - 7:00h: call [phone] Telephone: [phone]  9:30 AM 08/15/2023

## 2023-08-17 ENCOUNTER — Encounter: Payer: Self-pay | Admitting: Internal Medicine

## 2023-08-18 ENCOUNTER — Telehealth: Payer: Self-pay | Admitting: Internal Medicine

## 2023-08-18 NOTE — Telephone Encounter (Signed)
 Patient states did not get Jason Olson from pharmacy, Pharmacy is [facility] [address] Patient phone number is [phone].

## 2023-08-19 MED ORDER — FLUTICASONE FUROATE-VILANTEROL 200-25 MCG/ACT IN AEPB: 1.0000 | INHALATION_SPRAY | Freq: Every day | RESPIRATORY_TRACT | 11 refills | Status: AC

## 2023-08-19 NOTE — Telephone Encounter (Signed)
 Patient returned call. Informed patient we called in Breo to pharmacy.

## 2023-08-19 NOTE — Telephone Encounter (Signed)
 Breo sent to pharm  I called the pt and no answer-LMTCB

## 2023-09-18 ENCOUNTER — Other Ambulatory Visit (HOSPITAL_COMMUNITY): Payer: Self-pay

## 2023-09-18 MED ORDER — FLUTICASONE FUROATE-VILANTEROL 200-25 MCG/ACT IN AEPB: 1.0000 | INHALATION_SPRAY | Freq: Every day | RESPIRATORY_TRACT | 11 refills | Status: DC

## 2023-09-18 MED ORDER — FLUTICASONE PROPIONATE 50 MCG/ACT NA SUSP: 1.0000 | Freq: Every day | NASAL | 3 refills | Status: DC

## 2023-09-18 MED ORDER — ALBUTEROL SULFATE HFA 108 (90 BASE) MCG/ACT IN AERS
2.0000 | INHALATION_SPRAY | Freq: Four times a day (QID) | RESPIRATORY_TRACT | 1 refills | Status: AC | PRN
  Filled 2023-09-18: qty 6.7, 25d supply, fill #0

## 2023-09-24 ENCOUNTER — Other Ambulatory Visit (HOSPITAL_COMMUNITY): Payer: Self-pay

## 2023-09-29 ENCOUNTER — Ambulatory Visit: Payer: PRIVATE HEALTH INSURANCE | Admitting: Orthopedic Surgery

## 2023-09-29 DIAGNOSIS — S83242A Other tear of medial meniscus, current injury, left knee, initial encounter: Secondary | ICD-10-CM

## 2023-10-01 ENCOUNTER — Encounter: Payer: Self-pay | Admitting: Orthopedic Surgery

## 2023-10-01 MED ORDER — LIDOCAINE HCL 1 % IJ SOLN
5.0000 mL | INTRAMUSCULAR | Status: AC | PRN
  Administered 2023-09-29: 5 mL

## 2023-10-01 MED ORDER — BUPIVACAINE HCL 0.25 % IJ SOLN
4.0000 mL | INTRAMUSCULAR | Status: AC | PRN
  Administered 2023-09-29: 4 mL via INTRA_ARTICULAR

## 2023-10-01 NOTE — Progress Notes (Signed)
 Office Visit Note   Patient: Jason Olson           Date of Birth: 1966/03/29           MRN: [id number] Visit Date: 09/29/2023 Requested by: Rae Bugler, MD [phone] [address] [place],  [place] [address] PCP: Rae Bugler, MD  Subjective: Chief Complaint  Patient presents with   Left Knee - Pain    HPI: Jason Olson is a 58 y.o. male who presents to the office reporting left knee pain.  Patient had prior knee injection 02/05/2023.  Did get some relief.  He is using a knee brace.  Has known medial meniscal tear.  Overall he is doing well.  Not having much in the way of mechanical symptoms..                ROS: All systems reviewed are negative as they relate to the chief complaint within the history of present illness.  Patient denies fevers or chills.  Assessment & Plan: Visit Diagnoses:  1. Acute medial meniscus tear of left knee, initial encounter     Plan: Impression is currently relatively asymptomatic left knee medial meniscal tear.  No effusion today.  We will inject the knee again today but I do think with recurrent symptoms that arthroscopy and partial medial meniscectomy is indicated based on his tear morphology.  He will call us  when he wants to proceed with that.  I think in general the absence of an effusion today means that articular irritation is below the threshold for causing significant wear and tear in the joint.  Follow-Up Instructions: No follow-ups on file.   Orders:  No orders of the defined types were placed in this encounter.  No orders of the defined types were placed in this encounter.     Procedures: Large Joint Inj: L knee on 09/29/2023 7:14 AM Indications: diagnostic evaluation, joint swelling and pain Details: 18 G 1.5 in needle, superolateral approach  Arthrogram: No  Medications: 5 mL lidocaine  1 %; 4 mL bupivacaine  0.25 % Outcome: tolerated well, no immediate complications Procedure, treatment alternatives, risks and benefits  explained, specific risks discussed. Consent was given by the patient. Immediately prior to procedure a time out was called to verify the correct patient, procedure, equipment, support staff and site/side marked as required. Patient was prepped and draped in the usual sterile fashion.     Kenalog injected  Clinical Data: No additional findings.  Objective: Vital Signs: There were no vitals taken for this visit.  Physical Exam:  Constitutional: Patient appears well-developed HEENT:  Head: Normocephalic Eyes:EOM are normal Neck: Normal range of motion Cardiovascular: Normal rate Pulmonary/chest: Effort normal Neurologic: Patient is alert Skin: Skin is warm Psychiatric: Patient has normal mood and affect  Ortho Exam: Ortho exam demonstrates full range of motion of the left knee with no effusion.  Medial greater than lateral joint line tenderness with equivocal McMurray compression testing for medial compartment pathology.  Collateral cruciate ligaments are stable.  Specialty Comments:  No specialty comments available.  Imaging: No results found.   PMFS History: There are no active problems to display for this patient.  No past medical history on file.  Family History  Problem Relation Age of Onset   Stroke Mother    Cancer Father     No past surgical history on file. Social History   Occupational History   Not on file  Tobacco Use   Smoking status: Former    Current  packs/day: 0.00    Types: Cigarettes    Quit date: 08/11/2012    Years since quitting: 11.1   Smokeless tobacco: Not on file  Substance and Sexual Activity   Alcohol use: No   Drug use: No   Sexual activity: Yes

## 2023-10-15 ENCOUNTER — Other Ambulatory Visit: Payer: Self-pay | Admitting: *Deleted

## 2023-10-15 ENCOUNTER — Telehealth: Payer: Self-pay | Admitting: *Deleted

## 2023-10-15 DIAGNOSIS — Z87891 Personal history of nicotine dependence: Secondary | ICD-10-CM

## 2023-10-15 DIAGNOSIS — Z122 Encounter for screening for malignant neoplasm of respiratory organs: Secondary | ICD-10-CM

## 2023-10-15 NOTE — Telephone Encounter (Signed)
 Lung Cancer Screening Narrative/Criteria Questionnaire (Cigarette Smokers Only- No Cigars/Pipes/vapes)   Jason Olson   SDMV:11/12/23 9:00- Natalie                                           Sep 09, 1965              LDCT: 11/13/23 4:00- GI    57 y.o.   Phone: [phone]  Lung Screening Narrative (confirm age 64-77 yrs Medicare / 50-80 yrs Private pay insurance)   Insurance information:[employer]   Referring Provider:Ramaswamy   This screening involves an initial phone call with a team member from our program. It is called a shared decision making visit. The initial meeting is required by insurance and Medicare to make sure you understand the program. This appointment takes about 15-20 minutes to complete. The CT scan will completed at a separate date/time. This scan takes about 5-10 minutes to complete and you may eat and drink before and after the scan.  Criteria questions for Lung Cancer Screening:   Are you a current or former smoker? Former Age began smoking: 17   If you are a former smoker, what year did you quit smoking? 2015 (within 15 yrs)   To calculate your smoking history, I need an accurate estimate of how many packs of cigarettes you smoked per day and for how many years. (Not just the number of PPD you are now smoking)   Years smoking 30 x Packs per day 1 = Pack years 30   (at least 20 pack yrs)   (Make sure they understand that we need to know how much they have smoked in the past, not just the number of PPD they are smoking now)  Do you have a personal history of cancer?  No    Do you have a family history of cancer?Father (?)  Are you coughing up blood?  No  Have you had unexplained weight loss of 15 lbs or more in the last 6 months? No  It looks like you meet all criteria.     Additional information: N/A

## 2023-11-01 ENCOUNTER — Other Ambulatory Visit: Payer: Self-pay | Admitting: Internal Medicine

## 2023-11-12 ENCOUNTER — Ambulatory Visit (INDEPENDENT_AMBULATORY_CARE_PROVIDER_SITE_OTHER): Payer: PRIVATE HEALTH INSURANCE | Admitting: Primary Care

## 2023-11-12 ENCOUNTER — Encounter: Payer: Self-pay | Admitting: Primary Care

## 2023-11-12 ENCOUNTER — Ambulatory Visit: Admitting: Acute Care

## 2023-11-12 ENCOUNTER — Ambulatory Visit (INDEPENDENT_AMBULATORY_CARE_PROVIDER_SITE_OTHER): Payer: PRIVATE HEALTH INSURANCE | Admitting: Internal Medicine

## 2023-11-12 VITALS — BP 134/82 | HR 66 | Temp 98.0°F | Ht 72.0 in | Wt 155.0 lb

## 2023-11-12 DIAGNOSIS — R062 Wheezing: Secondary | ICD-10-CM

## 2023-11-12 DIAGNOSIS — J45991 Cough variant asthma: Secondary | ICD-10-CM | POA: Diagnosis not present

## 2023-11-12 DIAGNOSIS — J45909 Unspecified asthma, uncomplicated: Secondary | ICD-10-CM | POA: Insufficient documentation

## 2023-11-12 DIAGNOSIS — R0609 Other forms of dyspnea: Secondary | ICD-10-CM

## 2023-11-12 DIAGNOSIS — Z87891 Personal history of nicotine dependence: Secondary | ICD-10-CM

## 2023-11-12 DIAGNOSIS — J4530 Mild persistent asthma, uncomplicated: Secondary | ICD-10-CM

## 2023-11-12 DIAGNOSIS — R053 Chronic cough: Secondary | ICD-10-CM

## 2023-11-12 LAB — PULMONARY FUNCTION TEST
DL/VA % pred: 89 %
DL/VA: 3.83 ml/min/mmHg/L
DLCO cor % pred: 88 %
DLCO cor: 26.63 ml/min/mmHg
DLCO unc % pred: 88 %
DLCO unc: 26.63 ml/min/mmHg
FEF 25-75 Post: 2.71 L/s
FEF 25-75 Pre: 2.28 L/s
FEF2575-%Change-Post: 18 %
FEF2575-%Pred-Post: 80 %
FEF2575-%Pred-Pre: 67 %
FEV1-%Change-Post: 4 %
FEV1-%Pred-Post: 88 %
FEV1-%Pred-Pre: 84 %
FEV1-Post: 3.54 L
FEV1-Pre: 3.37 L
FEV1FVC-%Change-Post: 1 %
FEV1FVC-%Pred-Pre: 92 %
FEV6-%Change-Post: 3 %
FEV6-%Pred-Post: 97 %
FEV6-%Pred-Pre: 94 %
FEV6-Post: 4.89 L
FEV6-Pre: 4.74 L
FEV6FVC-%Change-Post: 0 %
FEV6FVC-%Pred-Post: 103 %
FEV6FVC-%Pred-Pre: 104 %
FVC-%Change-Post: 3 %
FVC-%Pred-Post: 93 %
FVC-%Pred-Pre: 90 %
FVC-Post: 4.92 L
FVC-Pre: 4.75 L
Post FEV1/FVC ratio: 72 %
Post FEV6/FVC ratio: 99 %
Pre FEV1/FVC ratio: 71 %
Pre FEV6/FVC Ratio: 100 %
RV % pred: 93 %
RV: 2.12 L
TLC % pred: 93 %
TLC: 6.93 L

## 2023-11-12 MED ORDER — MONTELUKAST SODIUM 10 MG PO TABS: 10.0000 mg | ORAL_TABLET | Freq: Every day | ORAL | 5 refills | Status: AC

## 2023-11-12 NOTE — Progress Notes (Signed)
 Virtual Visit via Telephone Note  I connected with [name] on 11/12/23 at  9:00 AM EDT by telephone and verified that I am speaking with the correct person using two identifiers.  Location: Patient: Jason Olson Provider: Alyse Bach, RN   I discussed the limitations, risks, security and privacy concerns of performing an evaluation and management service by telephone and the availability of in person appointments. I also discussed with the patient that there may be a patient responsible charge related to this service. The patient expressed understanding and agreed to proceed.   Shared Decision Making Visit Lung Cancer Screening Program [phone])   Eligibility: Age 58 y.o. Pack Years Smoking History Calculation 30 (# packs/per year x # years smoked) Recent History of coughing up blood  no Unexplained weight loss? no ( >Than 15 pounds within the last 6 months ) Prior History Lung / other cancer no (Diagnosis within the last 5 years already requiring surveillance chest CT Scans). Smoking Status Former Smoker Former Smokers: Years since quit: 11 years  Quit Date: 08/11/2012  Visit Components: Discussion included one or more decision making aids. yes Discussion included risk/benefits of screening. yes Discussion included potential follow up diagnostic testing for abnormal scans. yes Discussion included meaning and risk of over diagnosis. yes Discussion included meaning and risk of False Positives. yes Discussion included meaning of total radiation exposure. yes  Counseling Included: Importance of adherence to annual lung cancer LDCT screening. yes Impact of comorbidities on ability to participate in the program. yes Ability and willingness to under diagnostic treatment. yes  Smoking Cessation Counseling: Current Smokers:  Discussed importance of smoking cessation. yes Information about tobacco cessation classes and interventions provided to patient. yes Patient  provided with ticket for LDCT Scan. no Symptomatic Patient. no  Counseling(Intermediate counseling: > three minutes) 99406 Diagnosis Code: Tobacco Use Z72.0 Asymptomatic Patient yes  Counseling (Intermediate counseling: > three minutes counseling) U0454 Former Smokers:  Discussed the importance of maintaining cigarette abstinence. yes Diagnosis Code: Personal History of Nicotine Dependence. U98.119 Information about tobacco cessation classes and interventions provided to patient. Yes Patient provided with ticket for LDCT Scan. no Written Order for Lung Cancer Screening with LDCT placed in Epic. Yes (CT Chest Lung Cancer Screening Low Dose W/O CM) [id number] Z12.2-Screening of respiratory organs Z87.891-Personal history of nicotine dependence   Alyse Bach, RN

## 2023-11-12 NOTE — Patient Instructions (Addendum)
 VISIT SUMMARY: During your visit, we discussed your chronic cough and asthma management. You have experienced significant improvement in your symptoms since starting Breo, and we reviewed your current treatment plan. We also addressed your elevated eosinophil count, smoking history, and sinus issues.  YOUR PLAN: -ASTHMA: Asthma is a condition where your airways narrow and swell, producing extra mucus, which can make breathing difficult. You should continue using Breo daily and use albuterol  for any breakthrough symptoms or during respiratory infections. We are starting you on Singulair once daily at night to help manage your asthma and allergy symptoms. Be aware of potential side effects like mood changes and vivid dreams. We will also update your allergy testing to better understand your triggers.  -ELEVATED EOSINOPHIL COUNT: An elevated eosinophil count means you have higher levels of a type of white blood cell that can contribute to inflammation in asthma. Singulair may help reduce these levels, and we will monitor your eosinophil count with follow-up tests.  -TOBACCO USE: Given your history of smoking, you are at higher risk for lung cancer. You are scheduled for a low-dose CT scan to screen for lung cancer, which uses less radiation and is done annually.  -SINUS ISSUES: You have reported sinus congestion and occasional discharge. We discussed the possibility of nasal polyps and the option of a CT scan of your sinuses or a referral to an ENT specialist. For now, we will wait and see how your symptoms progress before taking further action.  INSTRUCTIONS: Please schedule a follow-up appointment in six months unless your symptoms worsen or new issues arise. Continue using Breo and start Singulair as discussed. Proceed with your scheduled low-dose CT scan for lung cancer screening. We will provide refills for Breo and Singulair for six months.  Orders: Respiratory allergy panel    Follow-up: 6 months with Dr. Bertrum Brodie or Jerlene Moody NP- cough variant asthma   Montelukast Chewable Tablets What is this medication? MONTELUKAST (mon te LOO kast) prevents and treats the symptoms of asthma and allergies. It works by decreasing inflammation in the airways, making it easier to breathe. Do not use this medication to treat a sudden asthma attack. This medicine may be used for other purposes; ask your health care provider or pharmacist if you have questions. COMMON BRAND NAME(S): Singulair What should I tell my care team before I take this medication? They need to know if you have any of these conditions: Liver disease Phenylketonuria An unusual or allergic reaction to montelukast, other medications, foods, dyes, or preservatives Pregnant or trying to get pregnant Breastfeeding How should I use this medication? Take this medication by mouth with water. Take it as directed on the prescription label at the same time every day. Chew or crush it completely before swallowing. Do not swallow tablets whole. You can take this medication with or without food. If it upsets your stomach, take it with food. Keep taking it unless your care team tells you to stop. A special MedGuide will be given to you by the pharmacist with each prescription and refill. Be sure to read this information carefully each time. Talk to your care team about the use of this medication in children. While it may be prescribed for children as young as 2 years for selected conditions, precautions do apply. Overdosage: If you think you have taken too much of this medicine contact a poison control center or emergency room at once. NOTE: This medicine is only for you. Do not share this medicine with others.  What if I miss a dose? If you miss a dose, skip it. Take your next dose at the normal time. Do not take extra or 2 doses at the same time to make up for the missed dose. What may interact with this  medication? Medications for seizures, such as phenytoin, phenobarbital, carbamazepine Rifabutin Rifampin This list may not describe all possible interactions. Give your health care provider a list of all the medicines, herbs, non-prescription drugs, or dietary supplements you use. Also tell them if you smoke, drink alcohol, or use illegal drugs. Some items may interact with your medicine. What should I watch for while using this medication? Visit your care team for regular checks on your progress. Tell your care team if your allergy or asthma symptoms do not improve. Take your medication even when you do not have symptoms. If you have asthma, talk to your care team about what to do in an acute asthma attack. Always have your inhaled rescue medication for asthma attacks with you. This medication may cause thoughts of suicide or depression. This includes sudden changes in mood, behaviors, or thoughts. Also watch for sudden changes in feelings and behaviors, such as feeling anxious, agitated, panicky, irritable, hostile, aggressive, impulsive, severely restless, overly excited and hyperactive, or not being able to sleep. Call your care team right away if you experience these behaviors or worsening depression. What side effects may I notice from receiving this medication? Side effects that you should report to your care team as soon as possible: Allergic reactions--skin rash, itching, hives, swelling of the face, lips, tongue, or throat Flu-like symptoms--fever, chills, muscle pain, cough, headache, fatigue Mood and behavior changes such as anxiety, nervousness, confusion, hallucinations, irritability, hostility, thoughts of suicide or self-harm, worsening mood, feelings of depression Pain, tingling, or numbness in the hands or feet Sinus pain or pressure around the face or forehead Trouble sleeping Vivid dreams or nightmares Side effects that usually do not require medical attention (report to your  care team if they continue or are bothersome): Cough Diarrhea Headache Runny or stuffy nose Sore throat Stomach pain This list may not describe all possible side effects. Call your doctor for medical advice about side effects. You may report side effects to FDA at 1-800-FDA-1088. Where should I keep my medication? Keep out of the reach of children and pets. Store between 15 and 30 degrees C (59 and 86 degrees F). Protect from light and moisture. Keep the container tightly closed. Get rid of any unused medication after the expiration date. To get rid of medications that are no longer needed or expired: Take the medication to a medication take-back program. Check with your pharmacy or law enforcement to find a location. If you cannot return the medication, check the label or package insert to see if the medication should be thrown out in the garbage or flushed down the toilet. If you are not sure, ask your care team. If it is safe to put in the trash, empty the medication out of the container. Mix the medication with cat litter, dirt, coffee grounds, or other unwanted substance. Seal the mixture in a bag or container. Put it in the trash. NOTE: This sheet is a summary. It may not cover all possible information. If you have questions about this medicine, talk to your doctor, pharmacist, or health care provider.  2024 Elsevier/Gold Standard (2021-12-10 00:00:00)

## 2023-11-12 NOTE — Patient Instructions (Signed)

## 2023-11-12 NOTE — Progress Notes (Signed)
 Full PFT performed today.

## 2023-11-12 NOTE — Progress Notes (Signed)
 @Patient  ID: Jason Olson, male    DOB: November 30, 1965, 58 y.o.   MRN: [id number]  No chief complaint on file.   Referring provider: Rae Bugler, MD  HPI: 58 year old male, former smoker.  Past medical history significant for asthma.   Previous [facility] pulmonary encounter: 08/15/2023 -  Dr. Brooke Canton  Chief Complaint  Patient presents with   Consult    Pt states he has had trouble breathing at night, 2 month prior had flu/ Pneumonia. Ct was done by PCP showed nothing, past smoker       HPI Jason Olson 58 y.o. -informed the CMA that he is here for chronic cough but told me that the chronic cough is really mild.  He informed me that he has shortness of breath for the last 1 year.  Background: Used to live outside [facility]  in [place].  Around 15 or 16 years ago he moved to [place] .  Around this time he quit smoking.  He had started smoking at age 57 and quit 15 years ago at age 53.  Therefore it makes him a 25 pack smoking history having smoked 1 pack/day.  Short after moving to [place]  has been bothered by perennial allergies.  He is on Allegra all the time.  He says he is not able to identify a single specific factor.  He works with [employer] doing [employer] but denies any dust exposure.  At home there is no mold or mildew or down pillow exposure or cockroach exposure.  His wife suffers from COPD Graves' disease and coronary artery disease status post coronary stents.  For the last 1 8 been bothered by shortness of breath with exertion relieved by rest.  Rated as moderate.  If he does yard work it will come on or if he does anything too much.  It is associated with wheezing at night when he lies down.  Is moderate in intensity.  He has a mild cough [although he told the CMA that he coughs all night].  He feels something is stuck in the throat.  When he used his wife albuterol  he brings up clear sputum.  He had influenza along with his wife 2 months ago  and this made everything worse but now he is somewhat better.    Dr Mitchel An Reflux Symptom Index (> 13-15 suggestive of LPR cough) 0 -> 5  =  none ->severe problem 08/15/2023 Feno 111 ppb  Hoarseness of problem with voice 0  Clearing  Of Throat 2  Excess throat mucus or feeling of post nasal drip 2  Difficulty swallowing food, liquid or tablets 0  Cough after eating or lying down 2  Breathing difficulties or choking episodes 2  Troublesome or annoying cough 0  Sensation of something sticking in throat or lump in throat 2  Heartburn, chest pain, indigestion, or stomach acid coming up 0  TOTAL 10   FENO 111ppb and elevated     Ambulatory Referral for Lung Cancer Scre         Diagnosis most likely is asthma allergic and cough variant. Previous smoking history puts her at risk for COPD and lung cancer   Plan - 5-day short course prednisone  - Start Breo scheduled - Start albuterol  as needed - Do full pulmonary function test - Referral lung cancer screening program     FOLLOWUP   Return in about 8 weeks (around 10/10/2023) for with any of the APPS, Face to Face Visit AFTER TESTS.   11/12/2023-  Interim hx  Discussed the use of AI scribe software for clinical note transcription with the patient, who gave verbal consent to proceed.  History of Present Illness   Jason Olson is a 58 year old male with allergic asthma who presents with chronic cough and asthma management.  He has experienced a chronic cough and mild shortness of breath for the past year. He has a history of smoking, having started at age 69 and quit around age 51, with a 25 pack-year smoking history. He works in [employer] with no dust exposure and reports no mold or mildew at home.  He was treated with a short course of prednisone  and started on Breo inhaler after seeing his previous provider. He uses albuterol  as needed for breakthrough symptoms. Since starting Breo, he reports significant improvement,  with no shortness of breath and no need for albuterol . He mentions nocturnal wheezing that has resolved since starting Breo. He previously used his wife's inhaler to relieve symptoms, which helped him cough up phlegm. He continues to use Breo once daily.  He wants to know what he is allergic to, as previous allergy testing was incomplete. His eosinophil count was 500.  He reports sinus issues, including congestion and occasional 'jelly ball' discharge from his sinuses. He has used a neti pot but experienced saline retention in his sinuses. He does not use Flonase  due to nasal dryness and bleeding. He takes Allegra daily and has tried using a neti pot with mixed results. He does not experience sinus infections but is concerned about possible nasal polyps.  He is scheduled for a lung cancer screening due to his smoking history. No sore throat or trouble swallowing. He gargles nightly after using Breo.      Pulmonary function testing 11/12/23 >> FVC 4.92 (93%), FEV1 3.54 (88%), ratio 72, TLC 93%, DLCO unc 26.62 (88%)    No Known Allergies  Immunization History  Administered Date(s) Administered   PFIZER(Purple Top)SARS-COV-2 Vaccination 08/01/2019, 08/31/2019    No past medical history on file.  Tobacco History: Social History   Tobacco Use  Smoking Status Former   Current packs/day: 0.00   Average packs/day: 1 pack/day for 29.2 years (29.2 ttl pk-yrs)   Types: Cigarettes   Start date: 63   Quit date: 08/11/2012   Years since quitting: 11.2  Smokeless Tobacco Not on file   Counseling given: Not Answered   Outpatient Medications Prior to Visit  Medication Sig Dispense Refill   albuterol  (PROVENTIL  HFA) 108 (90 Base) MCG/ACT inhaler Inhale 2 puffs into the lungs every 6 (six) hours as needed for wheezing or shortness of breath. 6.7 g 1   albuterol  (VENTOLIN  HFA) 108 (90 Base) MCG/ACT inhaler Inhale 2 puffs into the lungs every 6 (six) hours as needed for wheezing or shortness of  breath. 1 each 2   albuterol  (VENTOLIN  HFA) 108 (90 Base) MCG/ACT inhaler INHALE 2 PUFFS EVERY 6 HOURS AS NEEDED FOR WHEEZING OR SHORTNESS OF BREATH 8.5 g 2   amoxicillin -clavulanate (AUGMENTIN ) 875-125 MG per tablet Take 1 tablet by mouth 2 (two) times daily. 20 tablet 0   fluticasone  (FLONASE ) 50 MCG/ACT nasal spray Place 2 sprays into the nose daily. 16 g 0   fluticasone  (FLONASE ) 50 MCG/ACT nasal spray Shake liquid and place 1-2 sprays into both nostrils daily. 48 g 3   fluticasone  furoate-vilanterol (BREO ELLIPTA ) 200-25 MCG/ACT AEPB Inhale 1 puff into the lungs daily. 30 each 11   fluticasone  furoate-vilanterol (BREO ELLIPTA ) 200-25 MCG/ACT AEPB Inhale  1 puff into the lungs daily. 60 each 11   ibuprofen (ADVIL,MOTRIN) 100 MG/5ML suspension Take 200 mg by mouth every 4 (four) hours as needed for fever.     predniSONE  (DELTASONE ) 10 MG tablet Please take prednisone  40 mg x1 day, then 30 mg x1 day, then 20 mg x1 day, then 10 mg x1 day, and then 5 mg x1 day and stop 11 tablet 0   Pseudoeph-Doxylamine-DM-APAP (NYQUIL PO) Take by mouth.     Pseudoephedrine-APAP-DM (DAYQUIL PO) Take by mouth.     Facility-Administered Medications Prior to Visit  Medication Dose Route Frequency Provider Last Rate Last Admin   fluticasone  furoate-vilanterol (BREO ELLIPTA ) 200-25 MCG/ACT 1 puff  1 puff Inhalation Daily         Review of Systems  Review of Systems  Constitutional: Negative.   HENT:  Positive for congestion.   Respiratory: Negative.  Negative for shortness of breath and wheezing.     Physical Exam  There were no vitals taken for this visit. Physical Exam Constitutional:      General: He is not in acute distress.    Appearance: Normal appearance. He is not ill-appearing.  HENT:     Head: Normocephalic and atraumatic.     Mouth/Throat:     Mouth: Mucous membranes are moist.     Pharynx: Oropharynx is clear.   Cardiovascular:     Rate and Rhythm: Normal rate and regular rhythm.   Pulmonary:     Effort: Pulmonary effort is normal.     Breath sounds: Normal breath sounds. No wheezing, rhonchi or rales.   Neurological:     General: No focal deficit present.     Mental Status: He is alert and oriented to person, place, and time. Mental status is at baseline.   Psychiatric:        Mood and Affect: Mood normal.        Behavior: Behavior normal.        Thought Content: Thought content normal.        Judgment: Judgment normal.      Lab Results:  CBC    Component Value Date/Time   WBC 7.4 08/15/2023 0955   RBC 5.34 08/15/2023 0955   HGB 16.8 08/15/2023 0955   HCT 49.7 08/15/2023 0955   PLT 309.0 08/15/2023 0955   MCV 93.0 08/15/2023 0955   MCHC 33.7 08/15/2023 0955   RDW 13.2 08/15/2023 0955   LYMPHSABS 1.5 08/15/2023 0955   MONOABS 0.5 08/15/2023 0955   EOSABS 0.5 08/15/2023 0955   BASOSABS 0.1 08/15/2023 0955    BMET    Component Value Date/Time   NA 145 11/04/2009 1950   K 4.0 11/04/2009 1950   CL 108 11/04/2009 1950   GLUCOSE 99 11/04/2009 1950   BUN 9 11/04/2009 1950   CREATININE 1.2 11/04/2009 1950    BNP No results found for: BNP  ProBNP No results found for: PROBNP  Imaging: No results found.   Assessment & Plan:   No problem-specific Assessment & Plan notes found for this encounter.   Assessment and Plan    Allergic Asthma Chronic cough and mild dyspnea for the past year, consistent with cough variant asthma. Symptoms improved significantly with prednisone  and Breo. Nocturnal wheezing resolved with Breo. Pulmonary function tests show mild obstruction. Continued use of Breo is recommended. Discussed albuterol  for breakthrough symptoms and during respiratory infections.  - Continue Breo 200mcg daily. - Use albuterol  for breakthrough symptoms or during respiratory infections. - Start Singulair  once daily at night. - Provide information on Singulair, including potential side effects. - Order updated allergy testing,  including a RAST allergy panel.  Tobacco use Former smoker with a 25 pack-year history. Undergoing lung cancer screening due to smoking history. Scheduled for a low-dose CT scan, which involves less radiation and is performed annually. - Proceed with scheduled low-dose CT scan for lung cancer screening.  Goals of Care Discussed potential nasal polyps and sinus issues. Considered CT scan of sinuses versus ENT referral. He prefers to wait and see how symptoms progress before pursuing further imaging or ENT referral. Discussed the minimal risk of radiation with CT scans and the potential cost implications depending on insurance coverage. - Refer to ENT if sinus symptoms persist or worsen. - Hold off on CT scan of sinuses unless recurrent sinus infections occur.  Follow-up Follow-up in six months unless symptoms worsen or new issues arise. - Schedule follow-up appointment in six months. - Provide refills for Breo and Singulair for six months.   Antonio Baumgarten, NP 11/12/2023

## 2023-11-12 NOTE — Patient Instructions (Signed)
 Full PFT performed today.

## 2023-11-13 ENCOUNTER — Ambulatory Visit
Admission: RE | Admit: 2023-11-13 | Discharge: 2023-11-13 | Disposition: A | Discharge status: Home / Self Care | Source: Ambulatory Visit | Attending: Acute Care | Admitting: Acute Care

## 2023-11-13 DIAGNOSIS — Z122 Encounter for screening for malignant neoplasm of respiratory organs: Secondary | ICD-10-CM

## 2023-11-13 DIAGNOSIS — Z87891 Personal history of nicotine dependence: Secondary | ICD-10-CM

## 2023-11-14 LAB — RESPIRATORY ALLERGY PROFILE REGION II ~~LOC~~
Allergen, A. alternata, m6: <0.1 kU/L
Allergen, Cedar tree, t12: <0.1 kU/L
Allergen, Comm Silver Birch, t9: <0.1 kU/L
Allergen, Cottonwood, t14: <0.1 kU/L
Allergen, Mouse Urine Protein, e78: <0.1 kU/L
Allergen, Mulberry, t76: <0.1 kU/L
Allergen, Oak,t7: <0.1 kU/L
Allergen, P. notatum, m1: <0.1 kU/L
Aspergillus fumigatus, m3: <0.1 kU/L
Bermuda Grass: <0.1 kU/L
Box Elder IgE: <0.1 kU/L
CLADOSPORIUM HERBARUM (M2) IGE: <0.1 kU/L
COMMON RAGWEED (SHORT) (W1) IGE: <0.1 kU/L
Cat Dander: <0.1 kU/L
Class: 0
Class: 0
Class: 0
Class: 0
Class: 0
Class: 0
Class: 0
Class: 0
Class: 0
Class: 0
Class: 0
Class: 0
Class: 0
Class: 0
Class: 0
Class: 0
Class: 0
Class: 0
Class: 0
Class: 0
Class: 0
Class: 0
Class: 0
Class: 2
Cockroach: <0.1 kU/L
D. farinae: <0.1 kU/L
Dog Dander: <0.1 kU/L
Elm IgE: <0.1 kU/L
IgE (Immunoglobulin E), Serum: 70 kU/L (ref <=114)
IgE (Immunoglobulin E), Serum: <70 kU/L (ref <=114)
Johnson Grass: <0.1 kU/L
Pecan/Hickory Tree IgE: 2.48 kU/L — ABNORMAL HIGH
Rough Pigweed  IgE: <0.1 kU/L
Sheep Sorrel IgE: <0.1 kU/L
Timothy Grass: <0.1 kU/L

## 2023-11-14 LAB — INTERPRETATION:

## 2023-11-17 ENCOUNTER — Ambulatory Visit: Payer: Self-pay | Admitting: Primary Care

## 2023-11-27 ENCOUNTER — Other Ambulatory Visit: Payer: Self-pay | Admitting: Acute Care

## 2023-11-27 DIAGNOSIS — Z122 Encounter for screening for malignant neoplasm of respiratory organs: Secondary | ICD-10-CM

## 2023-11-27 DIAGNOSIS — Z87891 Personal history of nicotine dependence: Secondary | ICD-10-CM

## 2023-12-13 ENCOUNTER — Encounter: Payer: Self-pay | Admitting: Internal Medicine

## 2023-12-13 DIAGNOSIS — J4530 Mild persistent asthma, uncomplicated: Secondary | ICD-10-CM

## 2023-12-15 NOTE — Telephone Encounter (Signed)
 Thanks for notifying  Plan  Stop breo Give it 1-2 weeks Then start wiuxela/symbicort  inhaler -check with insurance what is cheapest

## 2023-12-16 MED ORDER — BUDESONIDE-FORMOTEROL FUMARATE 160-4.5 MCG/ACT IN AERO
2.0000 | INHALATION_SPRAY | Freq: Two times a day (BID) | RESPIRATORY_TRACT | 6 refills | Status: AC
Start: 2023-12-16 — End: ?

## 2024-01-19 ENCOUNTER — Encounter (INDEPENDENT_AMBULATORY_CARE_PROVIDER_SITE_OTHER): Payer: Self-pay

## 2024-01-20 ENCOUNTER — Other Ambulatory Visit: Payer: Self-pay | Admitting: Internal Medicine

## 2024-02-02 ENCOUNTER — Telehealth: Payer: Self-pay

## 2024-02-03 NOTE — Telephone Encounter (Signed)
 I called patient but got his voice mail.   He has cough variant asthma which improved in the past with prednisone  and BREO. There has been some messages about possibly side effects from [facility] but it sounds like he wants to continue BREO? Dizziness likely from head trauma. Rash, however, from BREO would be a reason to discontinue medication and try a different inhaler. Can you get more details about the rash- did it go away, was it specific to the inhaler?

## 2024-02-05 MED ORDER — PREDNISONE 10 MG PO TABS: ORAL_TABLET | ORAL | 0 refills | Status: AC

## 2024-02-05 NOTE — Telephone Encounter (Signed)
**Note De-identified  [place] Obfuscation** Please advise 

## 2024-02-05 NOTE — Telephone Encounter (Signed)
 I will send in pred taper

## 2024-03-18 NOTE — Telephone Encounter (Signed)
 nfn

## 2024-03-24 ENCOUNTER — Ambulatory Visit (INDEPENDENT_AMBULATORY_CARE_PROVIDER_SITE_OTHER)

## 2024-03-24 ENCOUNTER — Encounter (INDEPENDENT_AMBULATORY_CARE_PROVIDER_SITE_OTHER): Payer: Self-pay

## 2024-03-24 ENCOUNTER — Ambulatory Visit (INDEPENDENT_AMBULATORY_CARE_PROVIDER_SITE_OTHER): Admitting: Audiology

## 2024-03-24 VITALS — BP 154/88 | HR 55 | Temp 97.8°F | Ht 72.0 in | Wt 158.0 lb

## 2024-03-24 DIAGNOSIS — R42 Dizziness and giddiness: Secondary | ICD-10-CM

## 2024-03-24 DIAGNOSIS — H8111 Benign paroxysmal vertigo, right ear: Secondary | ICD-10-CM | POA: Diagnosis not present

## 2024-03-24 DIAGNOSIS — H6123 Impacted cerumen, bilateral: Secondary | ICD-10-CM

## 2024-03-24 DIAGNOSIS — H6121 Impacted cerumen, right ear: Secondary | ICD-10-CM

## 2024-03-24 DIAGNOSIS — Z0110 Encounter for examination of ears and hearing without abnormal findings: Secondary | ICD-10-CM

## 2024-03-24 DIAGNOSIS — J302 Other seasonal allergic rhinitis: Secondary | ICD-10-CM

## 2024-03-24 NOTE — Patient Instructions (Signed)
###   Epley Maneuver Instructions     **Instructions for Performing the Epley Maneuver**      1. **Start Position:** Have the patient sit upright on an exam table with legs extended. Turn the head 45 toward the affected ear (the side provoking vertigo).[1][2]      2. **Lie Back:** Assist the patient to lie back quickly so the head hangs slightly off the edge of the table (about 20 extension), still turned 45 toward the affected side. Hold this position for at least 30 seconds, or until any vertigo and nystagmus resolve.[1][2][3]      3. **Rotate Head:** Without lifting the head, turn it 90 toward the opposite (unaffected) side, so the face is now turned 45 toward the unaffected ear. Hold for at least 30 seconds.[1][2][3]      4. **Roll Body:** Ask the patient to roll onto their side in the direction they are facing, so the head is now turned downward (toward the floor) and the nose points down at a 45 angle. Hold for at least 30 seconds.[1][2][3]      5. **Sit Up:** Help the patient sit up slowly, keeping the head turned toward the unaffected side. Once upright, the head can be returned to neutral.[1][2][3]      **Tips:**      - Each position should be held for at least 30 seconds, or until symptoms resolve.      - A pillow under the shoulders may improve comfort and facilitate correct head extension.[3]      - After the maneuver, advise the patient to remain seated for about 15 minutes and to walk cautiously.[1]      - The maneuver may be repeated 2-3 times in one session if symptoms persist.[3][1]      **Contraindications:** Avoid the maneuver in patients with severe neck disease, unstable heart disease, or high-grade carotid stenosis.[4]      The Epley maneuver is highly effective, with symptom resolution rates up to 80-95% after one or more sessions.[1][2][3]      ### References  1. Benign Paroxysmal Positional Vertigo. Luke HARLAND North DS. The [place] Journal of Medicine.  2014;370(12):1138-47. doi:10.1056/NEJMcp1309481. 2. Clinical Practice Guideline: Benign Paroxysmal Positional Vertigo (Update). Milta SAILOR, Gubbels SP, Schwartz SR, et al. Otolaryngology--Head and Neck Surgery : Official Journal of American Academy of Otolaryngology-Head and Neck Surgery. 2017;156(3_suppl):S1-S47. doi:10.1177/0194599816689667. 3. The Semont-Plus Maneuver or the Epley Maneuver in Posterior Canal Benign Paroxysmal Positional Vertigo: A Randomized Clinical Study. Stevan CHRISTELLA Maurene CHRISTELLA, Vinck AS, et al. JAMA Neurology. 2023;80(8):798-804. doi:10.1001/jamaneurol.2023.1408. 4. Benign Paroxysmal Positional Vertigo. Danetta ONA Lesch SP. The [place] Journal of Medicine. 1999;341(21):1590-6. doi:10.1056/[id number].

## 2024-03-24 NOTE — Progress Notes (Signed)
 Dear Dr. Seabron, Here is my assessment for our mutual patient, Jason Olson. Thank you for allowing me the opportunity to care for your patient. Please do not hesitate to contact me should you have any other questions. Sincerely, Dr. Penne Croak  Otolaryngology Clinic Note Referring provider: Dr. Seabron HPI:  Discussed the use of AI scribe software for clinical note transcription with the patient, who gave verbal consent to proceed.  History of Present Illness Jason Olson is a 58 year old male who presents with dizziness and imbalance following a head injury.  Dizziness and imbalance - Persistent dizziness and imbalance since head injury approximately four months ago - Dizziness and imbalance were present prior to the head injury but have persisted and sometimes worsened since the incident - Initial head injury occurred at work when struck on the head by pipe benders, resulting in immediate headache and 'seeing stars'; headache resolved shortly after - One week post-injury, experienced an episode of crossed vision and a 'weird' feeling while driving, prompting him to return home and rest - Describes dizziness as a sensation of the room spinning, particularly severe after left ear cleaning during a medical visit - Quick movements, such as laying back quickly, can induce dizziness - No improvement in dizziness after temporarily discontinuing Breo inhaler  Sinonasal symptoms - History of sinus issues and allergies - Uses nasal sprays for allergies, which cause nasal dryness and epistaxis - Uses a saline rinse device but discontinued due to delayed drainage of saline from sinuses - Occasional clear 'jelly ball' discharge from sinuses - Allergy  panel positive for pecan pollen; numerous pecan trees in yard  Auditory symptoms and occupational noise exposure - 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 I personally reviewed and interpreted audiogram.    Normal hearing downsloping to borderline mild SNHL in the HF AU. 100% word discrimination.  PMH/Meds/All/SocHx/FamHx/ROS:  No past medical history on file.   No past surgical history on file.  Family History  Problem Relation Age of Onset   Stroke Mother    Cancer Father      Social Connections: Not on file      Current Outpatient Medications:    albuterol  (PROVENTIL  HFA) 108 (90 Base) MCG/ACT inhaler, Inhale 2 puffs into the lungs every 6 (six) hours as needed for wheezing or shortness of breath., Disp: 6.7 g, Rfl: 1   albuterol  (VENTOLIN  HFA) 108 (90 Base) MCG/ACT inhaler, Inhale 2 puffs into the lungs every 6 (six) hours as needed for wheezing or shortness of breath., Disp: 1 each, Rfl: 2   albuterol  (VENTOLIN  HFA) 108 (90 Base) MCG/ACT inhaler, INHALE 2 PUFFS EVERY 6 HOURS AS NEEDED FOR WHEEZING OR SHORTNESS OF BREATH, Disp: 8.5 g, Rfl: 2   budesonide -formoterol  (SYMBICORT ) 160-4.5 MCG/ACT inhaler, Inhale 2 puffs into the lungs 2 (two) times daily., Disp: 1 each, Rfl: 6   fluticasone  furoate-vilanterol (BREO ELLIPTA ) 200-25 MCG/ACT AEPB, Inhale 1 puff into the lungs  daily., Disp: 30 each, Rfl: 11   ibuprofen (ADVIL,MOTRIN) 100 MG/5ML suspension, Take 200 mg by mouth every 4 (four) hours as needed for fever., Disp: , Rfl:    montelukast  (SINGULAIR ) 10 MG tablet, Take 1 tablet (10 mg total) by mouth at bedtime.,  Disp: 30 tablet, Rfl: 5  Current Facility-Administered Medications:    fluticasone  furoate-vilanterol (BREO ELLIPTA ) 200-25 MCG/ACT 1 puff, 1 puff, Inhalation, Daily,    Physical Exam:   BP (!) 154/88 (BP Location: Right Arm, Patient Position: Sitting, Cuff Size: Normal)   Pulse (!) 55   Temp 97.8 F (36.6 C) (Oral)   Ht 6' (1.829 m)   Wt 158 lb (71.7 kg)   SpO2 98%   BMI 21.43 kg/m   The patient was awake, alert, and appropriate. The external ears were inspected, and otoscopy was performed to evaluate the external auditory canals and tympanic membranes. The nasal cavity and septum were examined for mucosal changes, obstruction, or discharge. The oral cavity and oropharynx were inspected for mucosal lesions, infection, or tonsillar hypertrophy. The neck was palpated for lymphadenopathy, thyroid abnormalities, or other masses. Cranial nerve function was grossly intact.  Pertinent Findings: Physical Exam HEENT: Left ear canal clean. Right ear canal with cerumen. NEUROLOGICAL: Dix-Hallpike maneuver positive on right side, indicating BPPV on right side.   Seprately Identifiable Procedures:  I personally ordered, reviewed and interpreted the following with the patient today   Epley Maneuver Procedure Note Template  Pre-Procedure Diagnosis: Benign Paroxysmal Positional Vertigo (BPPV) of the right posterior semicircular canal. Post-Procedure diagnosis:  Same  Procedure: Epley Maneuver (Canalith Repositioning Procedure)  Consent: Risks, benefits, and alternatives of the Epley maneuver were discussed with the patient. The patient was informed of potential worsening of vertigo, nausea, and vomiting during the procedure. The patient demonstrated understanding and provided verbal consent to proceed.  Procedure Details:  The patient was instructed to sit upright on the examination table with legs extended.  The patient's head was turned 45 degrees to the affected size. The patient was  then quickly lowered to a supine position, with the head extended off the table and supported by the provider, maintaining the 45-degree rotation. This position was held for [30-60 seconds] or until the induced vertigo and nystagmus subsided.The patient's head was then slowly rotated 90 degrees to the opposite side, maintaining the supine position. This position was held for [30-60 seconds] or until the induced vertigo and nystagmus subsided. The patient was then rolled onto their side, maintaining the head position, so that the patient was looking toward the floor. This position was held for [30-60 seconds] or until the induced vertigo and nystagmus subsided. The patient was then slowly returned to an upright, seated position with the head tilted forward.  Post-Procedure Assessment: Subjective: Patient's symptoms improved after the procedure. Patient denies significant nausea or other adverse reactions.  Plan:  Patient education: The patient was educated on the diagnosis of BPPV and the goal of the Epley maneuver.  Post-procedure instructions: The patient was instructed to avoid provocative head movements, such as lying flat or bending over, for the rest of the day. The patient was also advised to sleep with their head elevated on two pillows for the next [24-48 hours].  Home exercises: The patient was instructed on how to perform the Epley maneuver at home and was encouraged to do so if symptoms recur.   Coding:  CPT Code: 04007- mod 25  Procedure: Bilateral ear microscopy and cerumen removal using microscope (CPT 332 560 3122) -  Mod 25 Pre-procedure diagnosis: Cerumen impaction  external ear/ears Post-procedure diagnosis: same Indication:  cerumen impaction; given patient's otologic complaints and history as well as for improved and comprehensive examination of external ear and tympanic membrane, bilateral otologic examination using microscope was performed and impacted cerumen  removed  Procedure: Patient was placed semi-recumbent. Both ear canals were examined using the microscope with findings above. Cerumen removed on left and on right using suction and currette with improvement in EAC examination and patency. Left: EAC was patent. TM was intact . Middle ear was aerated. Drainage: absent Right: EAC was patent. TM was intact . Middle ear was aerated . Drainage: absent Patient tolerated the procedure well.   Impression & Plans:  Jason Olson is a 58 y.o. male  1. BPPV (benign paroxysmal positional vertigo), right   2. Seasonal allergies   3. Impacted cerumen of right ear    - Findings and diagnoses discussed in detail with the patient. - Risks, benefits, and alternatives were reviewed. Through shared decision making, the patient elects to proceed with below. Assessment & Plan Benign paroxysmal positional vertigo, right ear BPPV confirmed on the right side via Dix-Hallpike maneuver. - Perform Epley maneuver in-office. - Instruct on home Epley maneuver, explaining it does not displace otoliths. - Order vitamin D level test. - Schedule follow-up in one week. - Advise limiting aggressive head movements for three days. - Consider balance therapy if symptoms persist.  - Orders placed:  Orders Placed This Encounter  Procedures   Vitamin D (25 hydroxy)   Ambulatory referral to Audiology   - Medications prescribed/continued/adjusted: No orders of the defined types were placed in this encounter.  - Education materials provided to the patient. - Follow up: 1 week to recheck symptoms.. Patient instructed to return sooner or go to the ED if new/worsening symptoms develop.  Thank you for allowing me the opportunity to care for your patient. Please do not hesitate to contact me should you have any other questions.  Sincerely, Penne Croak, DO Otolaryngologist (ENT) [facility] Phone: [phone] Fax: [phone]  03/24/2024, 11:36 AM

## 2024-03-24 NOTE — Progress Notes (Signed)
  [address], [address] [place], [place] [address] [phone]  Audiological Evaluation    Name: Jason Olson     DOB:   [birth year]      MRN:   [id number]                                                                                     Service Date: 03/24/2024     Accompanied by: unaccompanied   Patient comes today after Dr. Anice, ENT sent a referral for a hearing evaluation due to concerns with hearing loss.   Symptoms Yes Details  Hearing loss  []    Tinnitus  [x]  occasional  Ear pain/ infections/pressure  []    Balance problems  [x]  Feels imbalanced, had vertigo when ears were flushed at the PCP  Noise exposure history  [x]  Wears hearing protection at work  Previous ear surgeries  []    Family history of hearing loss  []    Amplification  []    Other  []      Otoscopy: Right ear: Clear external ear canal and notable landmarks visualized on the tympanic membrane. Left ear:  Clear external ear canal and notable landmarks visualized on the tympanic membrane.  Tympanometry: Right ear: Normal external ear canal volume with normal middle ear pressure and tympanic membrane compliance (Type A). Findings are suggestive of normal middle ear function. Left ear: Normal external ear canal volume with normal middle ear pressure and tympanic membrane compliance (Type A). Findings are suggestive of normal middle ear function.  Hearing Evaluation The hearing test results were completed under headphones and results are deemed to be of good reliability. Test technique:  conventional    Pure tone Audiometry: Both ears: Normal hearing from 125 Hz - 8000 Hz.  Speech Audiometry: Right ear- Speech Reception Threshold (SRT) was obtained at 15 dBHL. Left ear-Speech Reception Threshold (SRT) was obtained at 15 dBHL.   Word Recognition Score Tested using NU-6 (recorded) Right ear: 100% was obtained at a presentation level of 55 dBHL with contralateral masking which is deemed as   excellent. Left ear: 100% was obtained at a presentation level of 55 dBHL with contralateral masking which is deemed as  excellent.   Impression: There is not a significant difference in pure-tone thresholds between ears. There is not a significant difference in the word recognition score in between ears.    Recommendations: Follow up with ENT as scheduled for today. Return for a hearing evaluation if concerns with hearing changes arise or per MD recommendation. Use hearing protection when exposed to loud/damaging sounds.    Kenroy Timberman MARIE LEROUX-MARTINEZ, AUD

## 2024-03-29 ENCOUNTER — Encounter: Payer: Self-pay | Admitting: Radiology

## 2024-04-02 ENCOUNTER — Ambulatory Visit (INDEPENDENT_AMBULATORY_CARE_PROVIDER_SITE_OTHER)

## 2024-04-15 ENCOUNTER — Other Ambulatory Visit: Payer: Self-pay | Admitting: Internal Medicine

## 2024-05-04 NOTE — Progress Notes (Signed)
 Virtual Visit via Telephone Note  I connected with Jason Olson on 05/04/24 at  9:00 AM EDT by telephone and verified that I am speaking with the correct person using two identifiers.  Location: Patient: at home Provider: [address], [place], [place], [address]    I discussed the limitations, risks, security and privacy concerns of performing an evaluation and management service by telephone and the availability of in person appointments. I also discussed with the patient that there may be a patient responsible charge related to this service. The patient expressed understanding and agreed to proceed.  Odie Rauen, RN

## 2024-05-13 ENCOUNTER — Telehealth: Payer: Self-pay

## 2024-05-13 ENCOUNTER — Ambulatory Visit: Admitting: Primary Care

## 2024-05-13 DIAGNOSIS — J45909 Unspecified asthma, uncomplicated: Secondary | ICD-10-CM

## 2024-05-13 NOTE — Telephone Encounter (Signed)
 ATC X2. LMTCB.   Pt is scheduled to see Landry Ferrari, NP at 4:00 pm today however, today is Beth's half day.   She has an opening at 11:00 am if pt would like to come in then.
